# Patient Record
Sex: Female | Born: 1980 | Race: White | Hispanic: No | Marital: Married | Smoking: Never smoker
Health system: Southern US, Community
[De-identification: ages and names within clinical notes are randomized; demographics above are authoritative.]

## PROBLEM LIST (undated history)

## (undated) DIAGNOSIS — N63 Unspecified lump in unspecified breast: Secondary | ICD-10-CM

## (undated) DIAGNOSIS — E669 Obesity, unspecified: Secondary | ICD-10-CM

## (undated) DIAGNOSIS — N920 Excessive and frequent menstruation with regular cycle: Principal | ICD-10-CM

## (undated) DIAGNOSIS — N946 Dysmenorrhea, unspecified: Secondary | ICD-10-CM

## (undated) DIAGNOSIS — E8881 Metabolic syndrome: Secondary | ICD-10-CM

## (undated) DIAGNOSIS — Z6828 Body mass index (BMI) 28.0-28.9, adult: Secondary | ICD-10-CM

## (undated) DIAGNOSIS — E88819 Insulin resistance, unspecified: Secondary | ICD-10-CM

## (undated) HISTORY — PX: TUBAL LIGATION: SHX77

## (undated) HISTORY — DX: Obesity, unspecified: E66.9

## (undated) HISTORY — DX: Excessive and frequent menstruation with regular cycle: N92.0

## (undated) HISTORY — DX: Dysmenorrhea, unspecified: N94.6

## (undated) HISTORY — PX: OTHER SURGICAL HISTORY: SHX169

## (undated) HISTORY — DX: Insulin resistance, unspecified: E88.819

## (undated) HISTORY — DX: Body mass index (bmi) 28.0-28.9, adult: Z68.28

## (undated) HISTORY — DX: Unspecified lump in unspecified breast: N63.0

## (undated) HISTORY — PX: CHOLECYSTECTOMY: SHX55

## (undated) HISTORY — DX: Metabolic syndrome: E88.81

## (undated) HISTORY — PX: CARPAL TUNNEL RELEASE: SHX101

---

## 2005-01-10 ENCOUNTER — Ambulatory Visit (HOSPITAL_COMMUNITY): Admission: AD | Admit: 2005-01-10 | Discharge: 2005-01-10 | Payer: Self-pay | Admitting: Obstetrics and Gynecology

## 2005-01-11 ENCOUNTER — Ambulatory Visit (HOSPITAL_COMMUNITY): Admission: AD | Admit: 2005-01-11 | Discharge: 2005-01-11 | Payer: Self-pay | Admitting: Obstetrics and Gynecology

## 2005-01-12 ENCOUNTER — Ambulatory Visit (HOSPITAL_COMMUNITY): Admission: AD | Admit: 2005-01-12 | Discharge: 2005-01-12 | Payer: Self-pay | Admitting: Obstetrics and Gynecology

## 2005-01-18 ENCOUNTER — Observation Stay (HOSPITAL_COMMUNITY): Admission: AD | Admit: 2005-01-18 | Discharge: 2005-01-19 | Payer: Self-pay | Admitting: Obstetrics and Gynecology

## 2005-01-29 ENCOUNTER — Ambulatory Visit (HOSPITAL_COMMUNITY): Admission: AD | Admit: 2005-01-29 | Discharge: 2005-01-29 | Payer: Self-pay | Admitting: Obstetrics and Gynecology

## 2005-02-08 ENCOUNTER — Inpatient Hospital Stay (HOSPITAL_COMMUNITY): Admission: AD | Admit: 2005-02-08 | Discharge: 2005-02-11 | Payer: Self-pay | Admitting: Obstetrics and Gynecology

## 2005-02-15 ENCOUNTER — Inpatient Hospital Stay (HOSPITAL_COMMUNITY): Admission: EM | Admit: 2005-02-15 | Discharge: 2005-02-15 | Payer: Self-pay | Admitting: Emergency Medicine

## 2005-02-28 ENCOUNTER — Ambulatory Visit (HOSPITAL_COMMUNITY): Admission: RE | Admit: 2005-02-28 | Discharge: 2005-02-28 | Payer: Self-pay | Admitting: General Surgery

## 2005-07-06 ENCOUNTER — Emergency Department (HOSPITAL_COMMUNITY): Admission: EM | Admit: 2005-07-06 | Discharge: 2005-07-06 | Payer: Self-pay | Admitting: Emergency Medicine

## 2005-12-17 ENCOUNTER — Emergency Department (HOSPITAL_COMMUNITY): Admission: EM | Admit: 2005-12-17 | Discharge: 2005-12-17 | Payer: Self-pay | Admitting: Emergency Medicine

## 2005-12-20 ENCOUNTER — Emergency Department (HOSPITAL_COMMUNITY): Admission: EM | Admit: 2005-12-20 | Discharge: 2005-12-20 | Payer: Self-pay | Admitting: Emergency Medicine

## 2006-06-03 ENCOUNTER — Emergency Department (HOSPITAL_COMMUNITY): Admission: EM | Admit: 2006-06-03 | Discharge: 2006-06-03 | Payer: Self-pay | Admitting: Emergency Medicine

## 2006-08-21 ENCOUNTER — Emergency Department (HOSPITAL_COMMUNITY): Admission: EM | Admit: 2006-08-21 | Discharge: 2006-08-22 | Payer: Self-pay | Admitting: Emergency Medicine

## 2007-05-28 ENCOUNTER — Other Ambulatory Visit: Admission: RE | Admit: 2007-05-28 | Discharge: 2007-05-28 | Payer: Self-pay | Admitting: Obstetrics and Gynecology

## 2007-07-28 ENCOUNTER — Inpatient Hospital Stay (HOSPITAL_COMMUNITY): Admission: AD | Admit: 2007-07-28 | Discharge: 2007-07-28 | Payer: Self-pay | Admitting: Obstetrics and Gynecology

## 2007-09-17 ENCOUNTER — Other Ambulatory Visit: Payer: Self-pay | Admitting: Family Medicine

## 2007-09-17 ENCOUNTER — Emergency Department (HOSPITAL_COMMUNITY): Admission: EM | Admit: 2007-09-17 | Discharge: 2007-09-18 | Payer: Self-pay | Admitting: Emergency Medicine

## 2008-02-09 ENCOUNTER — Inpatient Hospital Stay (HOSPITAL_COMMUNITY): Admission: AD | Admit: 2008-02-09 | Discharge: 2008-02-10 | Payer: Self-pay | Admitting: Obstetrics & Gynecology

## 2008-02-09 ENCOUNTER — Ambulatory Visit: Payer: Self-pay | Admitting: Obstetrics and Gynecology

## 2008-02-21 ENCOUNTER — Inpatient Hospital Stay (HOSPITAL_COMMUNITY): Admission: AD | Admit: 2008-02-21 | Discharge: 2008-02-24 | Payer: Self-pay | Admitting: Obstetrics & Gynecology

## 2008-02-21 ENCOUNTER — Ambulatory Visit: Payer: Self-pay | Admitting: Advanced Practice Midwife

## 2008-07-15 ENCOUNTER — Other Ambulatory Visit: Admission: RE | Admit: 2008-07-15 | Discharge: 2008-07-15 | Payer: Self-pay | Admitting: Obstetrics and Gynecology

## 2008-09-07 ENCOUNTER — Emergency Department (HOSPITAL_COMMUNITY): Admission: EM | Admit: 2008-09-07 | Discharge: 2008-09-07 | Payer: Self-pay | Admitting: Emergency Medicine

## 2009-02-02 ENCOUNTER — Ambulatory Visit: Payer: Self-pay | Admitting: Obstetrics & Gynecology

## 2009-02-02 ENCOUNTER — Inpatient Hospital Stay (HOSPITAL_COMMUNITY): Admission: AD | Admit: 2009-02-02 | Discharge: 2009-02-04 | Payer: Self-pay | Admitting: Obstetrics & Gynecology

## 2010-04-25 ENCOUNTER — Emergency Department (HOSPITAL_COMMUNITY)
Admission: EM | Admit: 2010-04-25 | Discharge: 2010-04-25 | Payer: Self-pay | Source: Home / Self Care | Admitting: Emergency Medicine

## 2010-06-09 ENCOUNTER — Other Ambulatory Visit (HOSPITAL_COMMUNITY)
Admission: RE | Admit: 2010-06-09 | Discharge: 2010-06-09 | Disposition: A | Discharge status: Home / Self Care | Payer: Managed Care, Other (non HMO) | Source: Ambulatory Visit | Attending: Obstetrics and Gynecology | Admitting: Obstetrics and Gynecology

## 2010-06-09 ENCOUNTER — Other Ambulatory Visit: Payer: Self-pay | Admitting: Adult Health

## 2010-06-09 DIAGNOSIS — Z01419 Encounter for gynecological examination (general) (routine) without abnormal findings: Secondary | ICD-10-CM | POA: Insufficient documentation

## 2010-06-17 ENCOUNTER — Emergency Department (HOSPITAL_COMMUNITY): Payer: Managed Care, Other (non HMO)

## 2010-06-17 ENCOUNTER — Emergency Department (HOSPITAL_COMMUNITY)
Admission: EM | Admit: 2010-06-17 | Discharge: 2010-06-17 | Disposition: A | Discharge status: Home / Self Care | Payer: Managed Care, Other (non HMO) | Attending: Emergency Medicine | Admitting: Emergency Medicine

## 2010-06-17 DIAGNOSIS — M549 Dorsalgia, unspecified: Secondary | ICD-10-CM | POA: Insufficient documentation

## 2010-06-17 DIAGNOSIS — Z79899 Other long term (current) drug therapy: Secondary | ICD-10-CM | POA: Insufficient documentation

## 2010-06-17 DIAGNOSIS — R091 Pleurisy: Secondary | ICD-10-CM | POA: Insufficient documentation

## 2010-06-17 DIAGNOSIS — R071 Chest pain on breathing: Secondary | ICD-10-CM | POA: Insufficient documentation

## 2010-06-17 DIAGNOSIS — M542 Cervicalgia: Secondary | ICD-10-CM | POA: Insufficient documentation

## 2010-06-17 LAB — D-DIMER, QUANTITATIVE: D-Dimer, Quant: 0.22 ug/mL-FEU (ref 0.00–0.48)

## 2010-07-14 LAB — RH IMMUNE GLOB WKUP(>/=20WKS)(NOT WOMEN'S HOSP): Fetal Screen: NEGATIVE

## 2010-07-14 LAB — CBC
HCT: 31.4 % — ABNORMAL LOW (ref 36.0–46.0)
HCT: 40.7 % (ref 36.0–46.0)
Hemoglobin: 10.7 g/dL — ABNORMAL LOW (ref 12.0–15.0)
Hemoglobin: 13.6 g/dL (ref 12.0–15.0)
MCHC: 33.5 g/dL (ref 30.0–36.0)
MCHC: 34.2 g/dL (ref 30.0–36.0)
MCV: 90.1 fL (ref 78.0–100.0)
MCV: 90.7 fL (ref 78.0–100.0)
Platelets: 126 10*3/uL — ABNORMAL LOW (ref 150–400)
Platelets: 148 10*3/uL — ABNORMAL LOW (ref 150–400)
RBC: 3.48 MIL/uL — ABNORMAL LOW (ref 3.87–5.11)
RBC: 4.49 MIL/uL (ref 3.87–5.11)
RDW: 14.8 % (ref 11.5–15.5)
RDW: 15 % (ref 11.5–15.5)
WBC: 9.6 10*3/uL (ref 4.0–10.5)
WBC: 9.7 10*3/uL (ref 4.0–10.5)

## 2010-07-14 LAB — GLUCOSE, CAPILLARY
Glucose-Capillary: 74 mg/dL (ref 70–99)
Glucose-Capillary: 83 mg/dL (ref 70–99)
Glucose-Capillary: 84 mg/dL (ref 70–99)
Glucose-Capillary: 93 mg/dL (ref 70–99)

## 2010-07-14 LAB — RPR: RPR Ser Ql: NONREACTIVE

## 2010-08-26 NOTE — Consult Note (Signed)
NAMEMATILYNN, DACEY NO.:  [id number]   MEDICAL RECORD NO.:  [id number]          PATIENT TYPE:  OIB   LOCATION:  LDR1                          FACILITY:  [facility]   PHYSICIAN:  Tilda Burrow, M.D. DATE OF BIRTH:  11-24-1980   DATE OF CONSULTATION:  DATE OF DISCHARGE:  01/29/2005                                   CONSULTATION   REASON FOR CONSULTATION:  Rule out labor.   HISTORY:  [name] is seen in labor and delivery on January 29, 2005 with  complaints of apparently regular contractions with no bleeding or rupture of  membranes.  She was placed on the monitor, which revealed a reactive  pattern.  Fetal heart was in the 160s-170s with no decelerations.  She heard  that she is afebrile.  Exam of the cervix is multiparous but tight  fingertip, posterior, long with ballotable vertex presentation.   IMPRESSION:  False labor.   DISPOSITION:  The patient is discharged home.  Labor and delivery time less  than one hour.      Tilda Burrow, M.D.  Electronically Signed     JVF/MEDQ  D:  01/29/2005  T:  01/30/2005  Job:  [id number]

## 2010-08-26 NOTE — H&P (Signed)
NAMEMASEY, SCHEIBER NO.:  [id number]   MEDICAL RECORD NO.:  [id number]          PATIENT TYPE:  OIB   LOCATION:  LDR4                          FACILITY:  [facility]   PHYSICIAN:  Tilda Burrow, M.D. DATE OF BIRTH:  Oct 26, 1980   DATE OF ADMISSION:  02/07/2005  DATE OF DISCHARGE:  LH                                HISTORY & PHYSICAL   OBSERVATION ADMISSION   REASON FOR ADMISSION:  Rule out labor.   HISTORY OF PRESENT ILLNESS:  The patient came in complaining of uterine  contractions.  The patient is for induction tonight so the patient will be  admitted and observed until followed up tonight at 5:00 P.M.   PAST MEDICAL HISTORY:  Negative.   PAST SURGICAL HISTORY:  Negative.   OBSTETRICAL HISTORY:  Prenatal course essentially uneventful.   LABORATORY DATA:  Blood type A negative.  Urine drug screen is negative.  Rubella is immune.  Hepatitis B surface antigen is negative.  HIV is  negative.  HSV is negative. Serology is nonreactive.  Pap smear is normal.  Gonorrhea and Chlamydia negative.  Repeat studies negative.  28 week  hemoglobin 13.2, 28 week hematocrit 40. One hour glucose was 142.  GBS is  unknown.   PLAN:  Admit for Foley bulb induction and will assess group B Strep status  and forward from the office the patient's GBS.  If positive, will treat with  intravenous Ampicillin 2 grams STAT and then 1 gram q.4h.      Zerita Boers, N.M.      Tilda Burrow, M.D.  Electronically Signed    DL/MEDQ  D:  40/34/7425  T:  02/08/2005  Job:  [id number]   cc:   Francoise Schaumann. Halford Chessman  Fax: [phone]   [facility]

## 2010-08-26 NOTE — H&P (Signed)
[name], [name] NO.:  [id number]   MEDICAL RECORD NO.:  [id number]           PATIENT TYPE:  OIB   LOCATION:  LDR4                          FACILITY:  [facility]   PHYSICIAN:  Lazaro Arms, M.D.   DATE OF BIRTH:  February 09, 1981   DATE OF ADMISSION:  DATE OF DISCHARGE:  LH                                HISTORY & PHYSICAL   CHIEF COMPLAINT:  Induction of labor secondary to maternal discomfort.   HISTORY:  The patient is a 30 year old, gravida 2, para 1, with an EDC of  February 13, 2005, based on first and second trimester ultrasound and  correlating last menstrual period.  She began prenatal care at about [redacted] weeks  gestation and has had regular visits since then.  Prenatal blood pressure  has been anywhere from 112 to 142/60s to 80s.  Total weight gain has been 10  pounds with appropriate fundal height growth.   PRENATAL LABORATORY DATA:  Blood type A negative.  Rubella is immune.  HBsAg, HIV, HSV, RPR, gonorrhea, and chlamydia are all negative. She had an  abnormal glucose tolerance test screening with subsequent normal 3-hour GTT.  Group B strep is also negative.  MSAFP was also normal.   PAST MEDICAL HISTORY:  Noncontributory.   PAST SURGICAL HISTORY:  None.   ALLERGIES:  No known drug allergies.   MEDICATIONS:  None.   SOCIAL HISTORY:  Denies cigarette smoking, alcohol or drug use.  She is  married.   OB HISTORY:  Had a vaginal delivery in 2003 at [redacted] weeks gestation that was a  5 pound 13 ounce female in [place].  She states she had pregnancy-  induced hypertension at the end of the pregnancy.   PHYSICAL EXAMINATION:  HEENT:  Within normal limits.  HEART:  Regular rate and rhythm.  LUNGS: Clear.  ABDOMEN:  Soft and nontender.  Fundal height is about 40 cm.  CERVICAL EXAM: Cervix is 2 cm, thick, -1 station.  EXTREMITIES:  Legs have trace edema.   IMPRESSION:  1.  Intrauterine pregnancy at 39-1/2 weeks.  2.  Elective induction of labor.   The  risks and benefits of being induced versus spontaneous labor were  discussed with the patient by Zerita Boers, nurse midwife, and they  decided together that inducing would be the way to go due to maternal  discomfort.  The patient plans to use [facility] for pediatrics.      [name], C.N.M.      Lazaro Arms, M.D.  Electronically Signed    FC/MEDQ  D:  02/07/2005  T:  02/07/2005  Job:  [id number]

## 2010-08-26 NOTE — Op Note (Signed)
[name], [name] NO.:  [id number]   MEDICAL RECORD NO.:  [id number]           PATIENT TYPE:  INP   LOCATION:  A402                          FACILITY:  [facility]   PHYSICIAN:  Tilda Burrow, M.D. DATE OF BIRTH:  26-Aug-1980   DATE OF PROCEDURE:  DATE OF DISCHARGE:                                 OPERATIVE REPORT   DELIVERY NOTE:  [name] progressed steadily through labor and developed an  irresistible urge to push at approximately 1315.  She arrived to labor and  delivery.  She was noted to be 10 cm dilated at +1 station.  She had a  pretty quick second stage; and had a spontaneous vaginal delivery of a  viable, female infant at 64.  The mouth and nose were suctioned on the  perineum; and the shoulder was delivered without difficulty.  Weight is 6  pounds 6 ounces, Apgars are 9 and 9.  Twenty units of Pitocin diluted in  1000 mL of lactated Ringers was then infused rapidly IV.   The placenta separated spontaneously and delivered by a controlled cord  traction and maternal pushing effort at 1348.  It was inspected and appears  to be intact with a 3-vessel cord.  The vagina was inspected and no  lacerations were found.  The estimated blood loss was 250 mL.  The epidural  catheter was then removed with the blue tip visualized as being intact.      [name], C.N.M.      Tilda Burrow, M.D.  Electronically Signed    FC/MEDQ  D:  02/09/2005  T:  02/09/2005  Job:  [id number]   cc:   Francoise Schaumann. Halford Chessman  Fax: [phone]

## 2010-08-26 NOTE — H&P (Signed)
NAMEGENNY, CAULDER NO.:  [id number]   MEDICAL RECORD NO.:  [id number]          PATIENT TYPE:  OIB   LOCATION:  LDR5                          FACILITY:  [facility]   PHYSICIAN:  Tilda Burrow, M.D. DATE OF BIRTH:  [birth year]   DATE OF ADMISSION:  01/11/2005  DATE OF DISCHARGE:  LH                                HISTORY & PHYSICAL   REASON FOR OBSERVATION ADMISSION:  Pregnancy 36 weeks with nausea and  vomiting, abdominal pain, diarrhea, and headache.   HISTORY OF PRESENT ILLNESS:  This has been going on since last night with  [place].  She now presents with the same said symptoms.   PHYSICAL EXAMINATION:  VITAL SIGNS:  Stable.  FETUS:  Stable.  PELVIC:  Cervix is closed, thick, anterior and high.   Ketones are positive in her urine.   PLAN:  1.  We are going to send a urine down.  2.  Get a CBC.  3.  Start an IV, __________  1,000 cc for hydration then 125.  4.  Address her nausea and vomiting and her headache.   Probable discharge later on this evening.      Zerita Boers, Lanier Clam      Tilda Burrow, M.D.  Electronically Signed    DL/MEDQ  D:  16/01/9603  T:  01/11/2005  Job:  [id number]   cc:   [facility]

## 2010-08-26 NOTE — Discharge Summary (Signed)
[name], [name]                 ACCOUNT NO.:  [id number]   MEDICAL RECORD NO.:  [id number]          PATIENT TYPE:  INP   LOCATION:  A427                          FACILITY:  [facility]   PHYSICIAN:  Tilda Burrow, M.D. DATE OF BIRTH:  1981-02-25   DATE OF ADMISSION:  02/14/2005  DATE OF DISCHARGE:  11/08/2006LH                                 DISCHARGE SUMMARY   ADMISSION DIAGNOSES:  1.  Postpartum endometritis.  2.  Fever of uncertain etiology.   DISCHARGE DIAGNOSIS:  Fever secondary to milk let down.  No evidence of  endometritis.   PROCEDURES:  1.  Intravenous antibiotic therapy x3 doses.  2.  Pelvic exam.   HISTORY OF PRESENT ILLNESS:  This 30 year old female now 8 days postpartum  was seen in the emergency room for fever recorded at home of 101.4  presenting to the hospital with emergency room evaluation showing a  temperature initially 100.1, rechecked at 98.5, tachycardic at 126,  respirations 20, blood pressure 145/72.  Exam in the emergency room revealed  complaints of continued lochia, heavy lochia and fever.  She had a white  count of 13,400, hemoglobin 12 and hematocrit 35% with 85% neutrophils.  Electrolytes were normal with BUN of 10, creatinine 0.7.  O2 saturations  were 99%.  She was admitted due to the low-grade fever and history of  elevated temperature at home.  She was admitted for IV antibiotic therapy.   HOSPITAL COURSE:  The patient was admitted shortly after midday and received  antibiotics x2 doses.  She was checked the following morning with pelvic  exam at midday revealing lochia considered within normal limits without  purulence or uterine tenderness.  She had breast engorgement from the breast  milk coming in that day and the patient not breast-feeding.  It was felt  that the most likely source of the fever was breast engorgement.  She was  observed for a few hours more and remained afebrile that afternoon and was  discharged at 6 p.m. having  received Unasyn 3 g IV x2 doses and was  discharged home in stable condition for routine postpartum followup.      Tilda Burrow, M.D.  Electronically Signed     JVF/MEDQ  D:  02/20/2005  T:  02/21/2005  Job:  [id number]

## 2010-08-26 NOTE — Op Note (Signed)
NAMEANGELLA, [name] NO.:  [id number]   MEDICAL RECORD NO.:  [id number]          PATIENT TYPE:  OIB   LOCATION:  LDR1                          FACILITY:  [facility]   PHYSICIAN:  Lazaro Arms, M.D.   DATE OF BIRTH:  09/27/1980   DATE OF PROCEDURE:  01/12/2005  DATE OF DISCHARGE:                                 OPERATIVE REPORT   [name] is actually back for the third day in a row with some complaints of  nausea, vomiting, not able to keep anything down. She was seen in labor and  delivery yesterday for the same complaint, received IV fluids, Phenergan.  She did have some lab work which was all normal except for ketones in her  urinalysis. Again, today, she dips 3+ ketones. So we are just going to give  her some IV fluids, maybe some Phenergan if she needs it, says she feels  okay right this second and does not want any now. She is having some mild  contractions about 8 to 10 minutes. Her cervix is 1, thick, -2, not any  change from when I examined her the other day. Just because she has been  sick for several days in a row, we will just take a look at her gallbladder  with an ultrasound to rule out that as a possible source of etiology for her  nausea and vomiting.      [name], C.N.M.      Lazaro Arms, M.D.  Electronically Signed    FC/MEDQ  D:  01/12/2005  T:  01/12/2005  Job:  [id number]   cc:   [facility]

## 2010-08-26 NOTE — Discharge Summary (Signed)
NAMEJO-ANN, JOHANNING NO.:  [id number]   MEDICAL RECORD NO.:  [id number]          PATIENT TYPE:  OIB   LOCATION:  LDR1                          FACILITY:  [facility]   PHYSICIAN:  Tilda Burrow, M.D. DATE OF BIRTH:  05/14/1980   DATE OF ADMISSION:  01/29/2005  DATE OF DISCHARGE:  10/22/2006LH                                 DISCHARGE SUMMARY   HOSPITAL COURSE:  Artha returns at 38 weeks complaining of active  contractions.  She has had no bleeding or gushing of fluid.  External  monitoring shows occasional mild contractions.  The patient is well-known to  this practice and prenatal records reviewed.   Cervical exam shows the cervix to be closed, long and posterior.  No change  from prior last visit.  The patient was given diagnosis of false labor and  sent home.  Routine labor instructions given.      Tilda Burrow, M.D.  Electronically Signed     JVF/MEDQ  D:  01/30/2005  T:  01/30/2005  Job:  [id number]

## 2010-08-26 NOTE — H&P (Signed)
NAMELOUANNA, [name] NO.:  [id number]   MEDICAL RECORD NO.:  [id number]          PATIENT TYPE:  OIB   LOCATION:  LDR3                          FACILITY:  [facility]   PHYSICIAN:  Tilda Burrow, M.D. DATE OF BIRTH:  1981-01-26   DATE OF ADMISSION:  01/18/2005  DATE OF DISCHARGE:  LH                                HISTORY & PHYSICAL   REASON FOR ADMISSION:  Pregnancy at 36 weeks and 3 days with lower abdominal  pain, sharp shooting pain, and inability to sleep and fatigue.   HISTORY OF PRESENT ILLNESS:  [name] has had lower abdominal pain now for the  past week.  States she is not sleeping and that the discomfort is getting  worse.  She thinks she is having contractions now, so will monitor her and  evaluate from there.   PAST MEDICAL HISTORY:  Negative.   PAST SURGICAL HISTORY:  Negative.   ALLERGIES:  No known allergies.   SOCIAL HISTORY:  She is married, and her family is present and supportive.   PRENATAL COURSE:  Essentially uneventful other than the fact that she has  had a lot of discomfort with this pregnancy.  Blood type is O negative.  Rubella is immune.  Hepatitis B surface antigen is negative.  HIV is  negative. Serology nonreactive. Pap normal. GC and chlamydia is negative.  Repeat is also negative.  AFP normal.  Twenty-eight-week hemoglobin 13, 28-  week hematocrit 40.  Glucose 142.   PHYSICAL EXAMINATION TODAY:  VITAL SIGNS:  Stable. Fetal heart rate pattern  stable with accelerations.  VAGINAL EXAM: 1 cm, thick and high.   PLAN:  1.  Therapeutic rest.  2.  Overnight observation.  3.  Probable discharge in the morning.      [name], [name]      Tilda Burrow, M.D.  Electronically Signed    DL/MEDQ  D:  16/01/9603  T:  01/18/2005  Job:  [id number]   cc:   Renette Butters

## 2010-08-26 NOTE — Op Note (Signed)
NAMEMAILYN, STEICHEN NO.:  [id number]   MEDICAL RECORD NO.:  [id number]          PATIENT TYPE:  INP   LOCATION:  LDR4                          FACILITY:  [facility]   PHYSICIAN:  Tilda Burrow, M.D. DATE OF BIRTH:  03/24/1981   DATE OF PROCEDURE:  02/09/2005  DATE OF DISCHARGE:                                 OPERATIVE REPORT   PROCEDURE:  Epidural catheter placement 9:30 a.m. on November 2.   Continuous lumbar epidural catheter placed at the L3-4 interspace on the  first attempt using loss-of-resistance technique. We were able to avoid the  tattooed areas on the skin as we raised a wheal. The interspace was  identified with some initial difficulty, and then the loss-of-resistance  technique allowed [place] to identify the epidural space at 7 cm beneath the  skin. A 5 cc test dose 1.5% lidocaine was introduced followed insertion of  the epidural catheter 3 to 4 cm into the epidural space and taping the  catheter to the back. We gave her 12 cc of continuous infusion. The patient  was beginning to get symmetric analgesic effect up to the groin at the time  of this dictation. We will not give an initial bolus as the patient is only  2 cm. Prognosis for vaginal delivery is uncertain at this time.      Tilda Burrow, M.D.  Electronically Signed     JVF/MEDQ  D:  02/09/2005  T:  02/09/2005  Job:  [id number]   cc:   [facility]

## 2010-08-26 NOTE — Discharge Summary (Signed)
NAMELUBA, MATZEN NO.:  [id number]   MEDICAL RECORD NO.:  [id number]          PATIENT TYPE:  INP   LOCATION:  A402                          FACILITY:  [facility]   PHYSICIAN:  Tilda Burrow, M.D. DATE OF BIRTH:  [birth year]   DATE OF ADMISSION:  02/07/2005  DATE OF DISCHARGE:  11/04/2006LH                                 DISCHARGE SUMMARY   HISTORY:  This 30 year old female gravida 2, para 1, was admitted for  induction due to maternal discomfort at 39-1/[redacted] weeks gestation. The  procedure was originally scheduled for February 07, 2005 and rescheduled for  November 1.   PROCEDURES:  1.  Foley bulb cervical ripening unsuccessful.  2.  Cervidil cervical ripening, discontinued.  3.  Continuous lumbar epidural, Dr. Jannifer Franklin.  4.  Spontaneous vertex vaginal delivery  5.  RhoGAM injection.   HOSPITAL COURSE:  The patient was admitted for induction of labor. Efforts  at Foley bulb cervical ripening were unsuccessful. We then used Cervidil  over night to attempt cervical ripening. This was not tolerated by the baby  due to hyperstimulation response. She therefore had this removed and the  following morning was simply placed on Pitocin induction of labor. EPIDURAL  ANALGESIA was placed by Dr. Jannifer Franklin.   She progressed steadily. Developed _complete dilation_ at 1:15p.m.,  delivered at 1:44 p.m. with a 6 pound, 5 ounce female infant, Apgar's 9 and 9.  There was no laceration requiring repair. EBL 256 cc. Epidural catheter was  removed with tip intact.   Postpartum course was straight forward with patient stable for discharge on  February 11, 2005. Maternal blood type is A negative. RhoGAM injection  administered. The patient was discharged home on February 11, 2005 with plans  for further contraception being an IUD after 4 weeks.      Tilda Burrow, M.D.  Electronically Signed     JVF/MEDQ  D:  02/11/2005  T:  02/11/2005  Job:  [id number]

## 2010-08-26 NOTE — Group Therapy Note (Signed)
[name], [name] NO.:  [id number]   MEDICAL RECORD NO.:  [id number]          PATIENT TYPE:  OIB   LOCATION:  LDR3                          FACILITY:  [facility]   PHYSICIAN:  Lazaro Arms, M.D.   DATE OF BIRTH:  Feb 24, 1981   DATE OF PROCEDURE:  01/10/2005  DATE OF DISCHARGE:                                   PROGRESS NOTE   [name] came to labor and delivery with complaints of questionable labor,  questionable rupture of membranes.  She describes as having to change her  panties or her panties getting wet more often than usual, no running down  the leg or anything like that.  She was monitored for two hours  electronically.  Just an occasional mild contraction was seen.  Fetal heart  rate was very reactant.  Sterile speculum exam was performed.  No pooling of  fluids.  Nitrazine negative and fern negative and appears visually to be  normal physiological fluid.  Cervix is fingertip dilated, fixed, -2 station.   IMPRESSION:  Intrauterine pregnancy at 35-1/2 weeks.  Normal physiological  discharge.  Pains of pregnancy.  She was discharged home in stable  condition.      [name], C.N.M.      Lazaro Arms, M.D.  Electronically Signed    FC/MEDQ  D:  01/10/2005  T:  01/11/2005  Job:  [id number]

## 2011-01-03 LAB — URINALYSIS, ROUTINE W REFLEX MICROSCOPIC
Bilirubin Urine: NEGATIVE
Glucose, UA: NEGATIVE
Hgb urine dipstick: NEGATIVE
Ketones, ur: 15 — AB
Nitrite: NEGATIVE
Protein, ur: NEGATIVE
Specific Gravity, Urine: 1.025
Urobilinogen, UA: 0.2
pH: 6.5

## 2011-01-03 LAB — BASIC METABOLIC PANEL
BUN: 7
CO2: 27
Calcium: 9
Chloride: 105
Creatinine, Ser: 0.63
GFR calc Af Amer: >60
GFR calc non Af Amer: >60
Glucose, Bld: 106 — ABNORMAL HIGH
Potassium: 3.9
Sodium: 138

## 2011-01-03 LAB — DIFFERENTIAL
Basophils Absolute: 0
Basophils Relative: 0
Eosinophils Absolute: 0.1
Eosinophils Relative: 1
Lymphocytes Relative: 32
Lymphs Abs: 3.1
Monocytes Absolute: 0.6
Monocytes Relative: 6
Neutro Abs: 6
Neutrophils Relative %: 61

## 2011-01-03 LAB — CBC
HCT: 37.2
Hemoglobin: 13.1
MCHC: 35.2
MCV: 87.1
Platelets: 217
RBC: 4.26
RDW: 13.1
WBC: 9.8

## 2011-01-03 LAB — POCT PREGNANCY, URINE
Operator id: 13440
Preg Test, Ur: POSITIVE

## 2011-01-05 LAB — DIFFERENTIAL
Basophils Absolute: 0.1
Basophils Relative: 1
Eosinophils Absolute: 0
Eosinophils Relative: 0
Lymphocytes Relative: 28
Lymphs Abs: 3.2
Monocytes Absolute: 0.6
Monocytes Relative: 5
Neutro Abs: 7.3
Neutrophils Relative %: 66

## 2011-01-05 LAB — URINALYSIS, ROUTINE W REFLEX MICROSCOPIC
Bilirubin Urine: NEGATIVE
Glucose, UA: 100 — AB
Ketones, ur: 15 — AB
pH: 7

## 2011-01-05 LAB — POCT I-STAT, CHEM 8
BUN: 6
Calcium, Ion: 1 — ABNORMAL LOW
Chloride: 109
Creatinine, Ser: 0.7
Glucose, Bld: 95
HCT: 36
Hemoglobin: 12.2
Potassium: 3.9
Sodium: 137
TCO2: 20

## 2011-01-05 LAB — CBC
HCT: 38.1
Hemoglobin: 13.7
MCHC: 35.8
MCV: 88.2
Platelets: 197
RBC: 4.33
RDW: 13.7
WBC: 11.2 — ABNORMAL HIGH

## 2011-01-05 LAB — COMPREHENSIVE METABOLIC PANEL
Albumin: 2.9 — ABNORMAL LOW
Alkaline Phosphatase: 32 — ABNORMAL LOW
BUN: 5 — ABNORMAL LOW
Chloride: 107
Glucose, Bld: 101 — ABNORMAL HIGH
Potassium: 3.8
Total Bilirubin: 0.4

## 2011-01-05 LAB — POCT CARDIAC MARKERS
CKMB, poc: <1 — ABNORMAL LOW
Myoglobin, poc: 17.4
Operator id: 277751
Troponin i, poc: <0.05

## 2011-01-05 LAB — D-DIMER, QUANTITATIVE: D-Dimer, Quant: 0.51 — ABNORMAL HIGH

## 2011-01-10 LAB — CBC
MCV: 89.4
Platelets: 155
RDW: 14.2
WBC: 9.2

## 2011-01-10 LAB — GLUCOSE, CAPILLARY
Glucose-Capillary: 75
Glucose-Capillary: 75
Glucose-Capillary: 95
Glucose-Capillary: 99

## 2011-01-10 LAB — RPR: RPR Ser Ql: NONREACTIVE

## 2012-07-26 ENCOUNTER — Emergency Department: Payer: Self-pay | Admitting: Emergency Medicine

## 2012-07-26 LAB — CBC
HGB: 13.2 g/dL (ref 12.0–16.0)
MCH: 28.9 pg (ref 26.0–34.0)
MCV: 85 fL (ref 80–100)
RBC: 4.56 10*6/uL (ref 3.80–5.20)
WBC: 8.6 10*3/uL (ref 3.6–11.0)

## 2012-07-26 LAB — COMPREHENSIVE METABOLIC PANEL
Albumin: 3.9 g/dL (ref 3.4–5.0)
BUN: 9 mg/dL (ref 7–18)
Chloride: 104 mmol/L (ref 98–107)
Co2: 31 mmol/L (ref 21–32)
EGFR (African American): >60
Potassium: 4 mmol/L (ref 3.5–5.1)
SGOT(AST): 30 U/L (ref 15–37)
Total Protein: 7.6 g/dL (ref 6.4–8.2)

## 2012-07-26 LAB — PREGNANCY, URINE: Pregnancy Test, Urine: NEGATIVE m[IU]/mL

## 2012-07-26 LAB — URINALYSIS, COMPLETE
Bilirubin,UR: NEGATIVE
Nitrite: NEGATIVE

## 2012-09-11 ENCOUNTER — Encounter: Payer: Self-pay | Admitting: *Deleted

## 2012-09-12 ENCOUNTER — Ambulatory Visit: Payer: Self-pay | Admitting: Adult Health

## 2013-11-27 ENCOUNTER — Ambulatory Visit (INDEPENDENT_AMBULATORY_CARE_PROVIDER_SITE_OTHER): Payer: No Typology Code available for payment source | Admitting: Adult Health

## 2013-11-27 ENCOUNTER — Encounter: Payer: Self-pay | Admitting: Adult Health

## 2013-11-27 VITALS — BP 122/70 | Ht 62.0 in | Wt 193.5 lb

## 2013-11-27 DIAGNOSIS — N63 Unspecified lump in unspecified breast: Secondary | ICD-10-CM

## 2013-11-27 DIAGNOSIS — R928 Other abnormal and inconclusive findings on diagnostic imaging of breast: Secondary | ICD-10-CM

## 2013-11-27 HISTORY — DX: Unspecified lump in unspecified breast: N63.0

## 2013-11-27 NOTE — Patient Instructions (Signed)
Breast Cyst A breast cyst is a sac in the breast that is filled with fluid. Breast cysts are common in women. Women can have one or many cysts. When the breasts contain many cysts, it is usually due to a noncancerous (benign) condition called fibrocystic change. These lumps form under the influence of female hormones (estrogen and progesterone). The lumps are most often located in the upper, outer portion of the breast. They are often more swollen, painful, and tender before your period starts. They usually disappear after menopause, unless you are on hormone therapy.  There are several types of cysts:  Macrocyst. This is a cyst that is about 2 in. (5.1 cm) in diameter.   Microcyst. This is a tiny cyst that you cannot feel but can be seen with a mammogram or an ultrasound.   Galactocele. This is a cyst containing milk that may develop if you suddenly stop breastfeeding.   Sebaceous cyst of the skin. This type of cyst is not in the breast tissue itself. Breast cysts do not increase your risk of breast cancer. However, they must be monitored closely because they can be cancerous.  CAUSES  It is not known exactly what causes a breast cyst to form. Possible causes include:  An overgrowth of milk glands and connective tissue in the breast can block the milk glands, causing them to fill with fluid.   Scar tissue in the breast from previous surgery may block the glands, causing a cyst.  RISK FACTORS Estrogen may influence the development of a breast cyst.  SIGNS AND SYMPTOMS   Feeling a smooth, round, soft lump (like a grape) in the breast that is easily moveable.   Breast discomfort or pain.  Increase in size of the lump before your menstrual period and decrease in its size after your menstrual period.  DIAGNOSIS  A cyst can be felt during a physical exam by your health care provider. A breast X-ray exam (mammogram) and ultrasonography will be done to confirm the diagnosis. Fluid may  be removed from the cyst with a needle (fine needle aspiration) to make sure the cyst is not cancerous.  TREATMENT  Treatment may not be necessary. Your health care provider may monitor the cyst to see if it goes away on its own. If treatment is needed, it may include:  Hormone treatment.   Needle aspiration. There is a chance of the cyst coming back after aspiration.   Surgery to remove the whole cyst.  HOME CARE INSTRUCTIONS   Keep all follow-up appointments with your health care provider.  See your health care provider regularly:  Get a yearly exam by your health care provider.  Have a clinical breast exam by a health care provider every 1-3 years if you are 20-40 years of age. After age 40 years, you should have the exam every year.   Get mammogram tests as directed by your health care provider.   Understand the normal appearance and feel of your breasts and perform breast self-exams.   Only take over-the-counter or prescription medicines as directed by your health care provider.   Wear a supportive bra, especially when exercising.   Avoid caffeine.   Reduce your salt intake, especially before your menstrual period. Too much salt can cause fluid retention, breast swelling, and discomfort.  SEEK MEDICAL CARE IF:   You feel, or think you feel, a lump in your breast.   You notice that both breasts look or feel different than usual.   Your   breast is still causing pain after your menstrual period is over.   You need medicine for breast pain and swelling that occurs with your menstrual period.  SEEK IMMEDIATE MEDICAL CARE IF:   You have severe pain, tenderness, redness, or warmth in your breast.   You have nipple discharge or bleeding.   Your breast lump becomes hard and painful.   You find new lumps or bumps that were not there before.   You feel lumps in your armpit (axilla).   You notice dimpling or wrinkling of the breast or nipple.   You  have a fever.  MAKE SURE YOU:  Understand these instructions.  Will watch your condition.  Will get help right away if you are not doing well or get worse. Document Released: 03/27/2005 Document Revised: 11/27/2012 Document Reviewed: 10/24/2012 [facility] Patient Information 2015 [place], [place]. This information is not intended to replace advice given to you by your health care provider. Make sure you discuss any questions you have with your health care provider. Mammogram 9/8 at 1:30 pm at [facility] Return in 4 weeks for pap and physical

## 2013-11-27 NOTE — Progress Notes (Signed)
Subjective:     Patient ID: [name], female   DOB: [birth year], 33 y.o.   MRN: [id number]  HPI [name] is a 33 year old white female, married, in complaining of right breast lump that is tender and has been there several months.  Review of Systems See HPI Reviewed past medical,surgical, social and family history. Reviewed medications and allergies.     Objective:   Physical Exam BP 122/70  Ht 5\' 2"  (1.575 m)  Wt 193 lb 8 oz (87.771 kg)  BMI 35.38 kg/m2  LMP 11/01/2013    Skin warm and dry,  Breasts:no dominate palpable mass, retraction or nipple discharge on the left,On the right, no retraction or nipple discharge, has nodule at 11 0'clock 2 FB from areola,that is firm and mobile, and a nodule at 1 o'clock that is 5 FB from areola that is tender and non mobile, will get diagnostic mammogram.  Assessment:     Right breast nodules   Plan:     Diagnostic mammogram at [facility] 9/8 at 1:40 pm Return in 4 weeks for pap and physical Review handout on breast cyst

## 2013-12-16 ENCOUNTER — Other Ambulatory Visit: Payer: Self-pay | Admitting: Adult Health

## 2013-12-16 ENCOUNTER — Ambulatory Visit (HOSPITAL_COMMUNITY)
Admission: RE | Admit: 2013-12-16 | Discharge: 2013-12-16 | Disposition: A | Discharge status: Home / Self Care | Payer: No Typology Code available for payment source | Source: Ambulatory Visit | Attending: Adult Health | Admitting: Adult Health

## 2013-12-16 DIAGNOSIS — N63 Unspecified lump in unspecified breast: Secondary | ICD-10-CM

## 2013-12-16 DIAGNOSIS — R928 Other abnormal and inconclusive findings on diagnostic imaging of breast: Secondary | ICD-10-CM | POA: Diagnosis present

## 2014-02-09 ENCOUNTER — Encounter: Payer: Self-pay | Admitting: Adult Health

## 2014-09-25 ENCOUNTER — Encounter: Payer: Self-pay | Admitting: Adult Health

## 2014-09-25 ENCOUNTER — Ambulatory Visit (INDEPENDENT_AMBULATORY_CARE_PROVIDER_SITE_OTHER): Payer: No Typology Code available for payment source | Admitting: Adult Health

## 2014-09-25 VITALS — BP 120/62 | HR 72 | Ht 62.0 in | Wt 180.5 lb

## 2014-09-25 DIAGNOSIS — Z713 Dietary counseling and surveillance: Secondary | ICD-10-CM

## 2014-09-25 DIAGNOSIS — E669 Obesity, unspecified: Secondary | ICD-10-CM

## 2014-09-25 HISTORY — DX: Obesity, unspecified: E66.9

## 2014-09-25 MED ORDER — PHENTERMINE HCL 37.5 MG PO TABS: 37.5000 mg | ORAL_TABLET | Freq: Every day | ORAL | Status: DC

## 2014-09-25 NOTE — Progress Notes (Signed)
Subjective:     Patient ID: [name], female   DOB: 12-Sep-1980, 34 y.o.   MRN: [id number]  HPI Atiyana is a 34 year old white female, married in complaining with her weight and wants to try meds to help jump start efforts, has stopped sodas for last 3 days.  Review of Systems Patient denies any headaches, hearing loss, fatigue, blurred vision, shortness of breath, chest pain, abdominal pain, problems with bowel movements, urination, or intercourse. No joint pain or mood swings. Reviewed past medical,surgical, social and family history. Reviewed medications and allergies.     Objective:   Physical Exam BP 120/62 mmHg  Pulse 72  Ht 5\' 2"  (1.575 m)  Wt 180 lb 8 oz (81.874 kg)  BMI 33.01 kg/m2  LMP 09/02/2014 Skin warm and dry. Neck: mid line trachea, normal thyroid, good ROM, no lymphadenopathy noted. Lungs: clear to ausculation bilaterally. Cardiovascular: regular rate and rhythm.Discussed eating more protein and less carbs, will rx adipex too.    Assessment:     Weight loss counseling Obesity     Plan:     Rx adipex 37.5 mg #30 take 1 daily Return in 4 weeks for pap and physical Review handout on weight loss

## 2014-09-25 NOTE — Patient Instructions (Signed)

## 2014-10-26 ENCOUNTER — Ambulatory Visit (INDEPENDENT_AMBULATORY_CARE_PROVIDER_SITE_OTHER): Payer: BLUE CROSS/BLUE SHIELD | Admitting: Adult Health

## 2014-10-26 ENCOUNTER — Other Ambulatory Visit (HOSPITAL_COMMUNITY)
Admission: RE | Admit: 2014-10-26 | Discharge: 2014-10-26 | Disposition: A | Discharge status: Home / Self Care | Payer: BLUE CROSS/BLUE SHIELD | Source: Ambulatory Visit | Attending: Adult Health | Admitting: Adult Health

## 2014-10-26 ENCOUNTER — Encounter: Payer: Self-pay | Admitting: Adult Health

## 2014-10-26 VITALS — BP 120/80 | HR 88 | Ht 63.0 in | Wt 165.5 lb

## 2014-10-26 DIAGNOSIS — Z1151 Encounter for screening for human papillomavirus (HPV): Secondary | ICD-10-CM | POA: Insufficient documentation

## 2014-10-26 DIAGNOSIS — Z01419 Encounter for gynecological examination (general) (routine) without abnormal findings: Secondary | ICD-10-CM | POA: Insufficient documentation

## 2014-10-26 DIAGNOSIS — E669 Obesity, unspecified: Secondary | ICD-10-CM

## 2014-10-26 DIAGNOSIS — Z713 Dietary counseling and surveillance: Secondary | ICD-10-CM

## 2014-10-26 MED ORDER — PHENTERMINE HCL 37.5 MG PO TABS: 37.5000 mg | ORAL_TABLET | Freq: Every day | ORAL | Status: DC

## 2014-10-26 NOTE — Patient Instructions (Signed)
Physical in 1 year Continue adipex Follow up in 4 weeks

## 2014-10-26 NOTE — Progress Notes (Signed)
Patient ID: Bonnye Halle Bumbaugh, female   DOB: May 19, 1980, 34 y.o.   MRN: [id number] History of Present Illness:  Madiline is a 34 year old white female in for well woman gyn exam and pap, and weight check, she has been on adipex for 4 weeks and has lost 15 lbs.And she changed her hair color, and has bought a smaller size and is very happy.  Current Medications, Allergies, Past Medical History, Past Surgical History, Family History and Social History were reviewed in [employer] record.     Review of Systems: Patient denies any headaches, hearing loss, fatigue, blurred vision, shortness of breath, chest pain, abdominal pain, problems with bowel movements, urination, or intercourse. No joint pain or mood swings.    Physical Exam:BP 120/80 mmHg  Pulse 88  Ht 5\' 3"  (1.6 m)  Wt 165 lb 8 oz (75.07 kg)  BMI 29.32 kg/m2  LMP 10/03/2014 General:  Well developed, well nourished, no acute distress Skin:  Warm and dry Neck:  Midline trachea, normal thyroid, good ROM, no lymphadenopathy Lungs; Clear to auscultation bilaterally Breast:  No dominant palpable mass, retraction, or nipple discharge Cardiovascular: Regular rate and rhythm Abdomen:  Soft, non tender, no hepatosplenomegaly Pelvic:  External genitalia is normal in appearance, no lesions.  The vagina is normal in appearance. Urethra has no lesions or masses. The cervix is bulbous, with nabothian cyst at 9 o'clock, pap with HPV performed, cervix was friable with EC brush.  Uterus is felt to be normal size, shape, and contour.  No adnexal masses or tenderness noted.Bladder is non tender, no masses felt. Extremities/musculoskeletal:  No swelling or varicosities noted, no clubbing or cyanosis Psych:  No mood changes, alert and cooperative,seems happy She wants to continue weight loss, her goal is around 140 lbs.  Impression: Well woman gyn exam with pap Weight loss counseling Obesity     Plan: Rx adipex 32.5 mg #30 take 1  daily no refills Follow up in 4 weeks Physical in 1 year, pap in 3 years if normal with negative HPV

## 2014-10-28 LAB — CYTOLOGY - PAP

## 2014-11-23 ENCOUNTER — Ambulatory Visit (INDEPENDENT_AMBULATORY_CARE_PROVIDER_SITE_OTHER): Payer: BLUE CROSS/BLUE SHIELD | Admitting: Adult Health

## 2014-11-23 ENCOUNTER — Encounter: Payer: Self-pay | Admitting: Adult Health

## 2014-11-23 VITALS — BP 118/70 | HR 80 | Ht 63.0 in | Wt 158.0 lb

## 2014-11-23 DIAGNOSIS — Z6828 Body mass index (BMI) 28.0-28.9, adult: Secondary | ICD-10-CM | POA: Diagnosis not present

## 2014-11-23 DIAGNOSIS — Z713 Dietary counseling and surveillance: Secondary | ICD-10-CM

## 2014-11-23 MED ORDER — PHENTERMINE HCL 37.5 MG PO TABS: 37.5000 mg | ORAL_TABLET | Freq: Every day | ORAL | Status: DC

## 2014-11-23 NOTE — Patient Instructions (Signed)
Continue weight loss efforts Follow up in 4 weeks

## 2014-11-23 NOTE — Progress Notes (Signed)
Subjective:     Patient ID: [name], female   DOB: 1981/03/04, 34 y.o.   MRN: [id number]  HPI [name] is a 34 year old white female, in for weight and BP check, no complaints.She just bought size 8 pants.And says she feels better.She wants to get to 140 lbs. And be able to maintain.   Review of Systems Patient denies any headaches, hearing loss, fatigue, blurred vision, shortness of breath, chest pain, abdominal pain, problems with bowel movements, urination, or intercourse. No joint pain or mood swings. Reviewed past medical,surgical, social and family history. Reviewed medications and allergies.     Objective:   Physical Exam BP 118/70 mmHg  Pulse 80  Ht 5\' 3"  (1.6 m)  Wt 158 lb (71.668 kg)  BMI 28.00 kg/m2  LMP 10/28/2014 Skin warm and dry.  Lungs: clear to ausculation bilaterally. Cardiovascular: regular rate and rhythm. She has lost 22.5 lbs total and 7.5 this last 4 weeks she wants to continue, she is eating better and is exercising on elliptical.Discussed trying to continue exercising and using fit bit.    Assessment:     Weight loss counseling BMI 28    Plan:     Refilled adipex 37.5 mg   Follow up in 4 weeks

## 2014-12-28 ENCOUNTER — Encounter: Payer: Self-pay | Admitting: Adult Health

## 2014-12-28 ENCOUNTER — Ambulatory Visit (INDEPENDENT_AMBULATORY_CARE_PROVIDER_SITE_OTHER): Payer: BLUE CROSS/BLUE SHIELD | Admitting: Adult Health

## 2014-12-28 VITALS — BP 120/70 | HR 72 | Ht 63.0 in | Wt 150.0 lb

## 2014-12-28 DIAGNOSIS — Z713 Dietary counseling and surveillance: Secondary | ICD-10-CM

## 2014-12-28 DIAGNOSIS — Z6826 Body mass index (BMI) 26.0-26.9, adult: Secondary | ICD-10-CM | POA: Diagnosis not present

## 2014-12-28 NOTE — Progress Notes (Signed)
Subjective:     Patient ID: [name], female   DOB: 12-Sep-1980, 34 y.o.   MRN: [id number]  HPI [name] is a 34 year old white female, married in for weight and BP check, has been on adipex for 3 months and has lost 30.5 lbs total, 8 in last 4 weeks.She says she feels good and is happy and feels she can lose the last 10 lbs on her on, has about 14 tabs at home.  Review of Systems Patient denies any headaches, hearing loss, fatigue, blurred vision, shortness of breath, chest pain, abdominal pain, problems with bowel movements, urination, or intercourse. No joint pain or mood swings. Reviewed past medical,surgical, social and family history. Reviewed medications and allergies.     Objective:   Physical Exam BP 120/70 mmHg  Pulse 72  Ht 5\' 3"  (1.6 m)  Wt 150 lb (68.04 kg)  BMI 26.58 kg/m2  LMP 12/26/2014 Skin warm and dry.  Lungs: clear to ausculation bilaterally. Cardiovascular: regular rate and rhythm.    Assessment:     Weight loss counseling BMI 26.58    Plan:     Continue weight loss efforts Follow up prn

## 2014-12-28 NOTE — Patient Instructions (Signed)
Continue weight loss efforts Follow up prn

## 2015-03-16 ENCOUNTER — Encounter: Payer: Self-pay | Admitting: Adult Health

## 2015-03-16 ENCOUNTER — Ambulatory Visit (INDEPENDENT_AMBULATORY_CARE_PROVIDER_SITE_OTHER): Payer: BLUE CROSS/BLUE SHIELD | Admitting: Adult Health

## 2015-03-16 VITALS — BP 138/60 | HR 90 | Ht 63.0 in | Wt 154.0 lb

## 2015-03-16 DIAGNOSIS — Z6827 Body mass index (BMI) 27.0-27.9, adult: Secondary | ICD-10-CM | POA: Diagnosis not present

## 2015-03-16 DIAGNOSIS — N920 Excessive and frequent menstruation with regular cycle: Secondary | ICD-10-CM | POA: Diagnosis not present

## 2015-03-16 DIAGNOSIS — N946 Dysmenorrhea, unspecified: Secondary | ICD-10-CM

## 2015-03-16 DIAGNOSIS — Z713 Dietary counseling and surveillance: Secondary | ICD-10-CM | POA: Diagnosis not present

## 2015-03-16 HISTORY — DX: Dysmenorrhea, unspecified: N94.6

## 2015-03-16 HISTORY — DX: Excessive and frequent menstruation with regular cycle: N92.0

## 2015-03-16 MED ORDER — PHENTERMINE HCL 37.5 MG PO CAPS: 37.5000 mg | ORAL_CAPSULE | ORAL | Status: DC

## 2015-03-16 NOTE — Progress Notes (Signed)
Subjective:     Patient ID: [name], female   DOB: Jan 22, 1981, 34 y.o.   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  Review of Systems Patient denies any headaches, hearing loss, fatigue, blurred vision, shortness of breath, chest pain, problems with bowel movements, urination, or intercourse. No joint pain or mood swings.See HPI for positives. Reviewed past medical,surgical, social and family history. Reviewed medications and allergies.     Objective:   Physical Exam BP 138/60 mmHg  Pulse 90  Ht 5\' 3"  (1.6 m)  Wt 154 lb (69.854 kg)  BMI 27.29 kg/m2  LMP 02/26/2015 Skin warm and dry. Neck: mid line trachea, normal thyroid, good ROM, no lymphadenopathy noted. Lungs: clear to ausculation bilaterally. Cardiovascular: regular rate and rhythm. Face time 15 minutes with 50 % counseling over ablation and restarting weight loss efforts.    Assessment:    Menorrhagia  Dysmenorrhea Weight loss counseling BMI 27.29     Plan:    Review handout on ablation Rx adipex 37.5 mg #30 take 1 daily, no refills Return 12/19 to talk surgery with Dr Glo Herring Return in 4 weeks for weight and BP check

## 2015-03-16 NOTE — Patient Instructions (Signed)

## 2015-03-29 ENCOUNTER — Other Ambulatory Visit: Payer: Self-pay | Admitting: Obstetrics and Gynecology

## 2015-03-29 ENCOUNTER — Encounter: Payer: Self-pay | Admitting: Obstetrics and Gynecology

## 2015-03-29 ENCOUNTER — Encounter (HOSPITAL_COMMUNITY)
Admission: RE | Admit: 2015-03-29 | Discharge: 2015-03-29 | Disposition: A | Discharge status: Home / Self Care | Payer: BLUE CROSS/BLUE SHIELD | Source: Ambulatory Visit | Attending: Obstetrics and Gynecology | Admitting: Obstetrics and Gynecology

## 2015-03-29 ENCOUNTER — Encounter (HOSPITAL_COMMUNITY): Payer: Self-pay

## 2015-03-29 ENCOUNTER — Ambulatory Visit (INDEPENDENT_AMBULATORY_CARE_PROVIDER_SITE_OTHER): Payer: BLUE CROSS/BLUE SHIELD | Admitting: Obstetrics and Gynecology

## 2015-03-29 VITALS — BP 110/60 | HR 74 | Ht 63.0 in | Wt 149.0 lb

## 2015-03-29 DIAGNOSIS — Z01818 Encounter for other preprocedural examination: Secondary | ICD-10-CM | POA: Diagnosis not present

## 2015-03-29 DIAGNOSIS — N63 Unspecified lump in breast: Secondary | ICD-10-CM | POA: Diagnosis not present

## 2015-03-29 DIAGNOSIS — Z8051 Family history of malignant neoplasm of kidney: Secondary | ICD-10-CM | POA: Diagnosis not present

## 2015-03-29 DIAGNOSIS — Z9851 Tubal ligation status: Secondary | ICD-10-CM | POA: Diagnosis not present

## 2015-03-29 DIAGNOSIS — Z801 Family history of malignant neoplasm of trachea, bronchus and lung: Secondary | ICD-10-CM | POA: Diagnosis not present

## 2015-03-29 DIAGNOSIS — N946 Dysmenorrhea, unspecified: Secondary | ICD-10-CM | POA: Diagnosis not present

## 2015-03-29 DIAGNOSIS — Z8379 Family history of other diseases of the digestive system: Secondary | ICD-10-CM | POA: Diagnosis not present

## 2015-03-29 DIAGNOSIS — Z8249 Family history of ischemic heart disease and other diseases of the circulatory system: Secondary | ICD-10-CM | POA: Diagnosis not present

## 2015-03-29 DIAGNOSIS — Z881 Allergy status to other antibiotic agents status: Secondary | ICD-10-CM | POA: Diagnosis not present

## 2015-03-29 DIAGNOSIS — Z9049 Acquired absence of other specified parts of digestive tract: Secondary | ICD-10-CM | POA: Diagnosis not present

## 2015-03-29 DIAGNOSIS — E669 Obesity, unspecified: Secondary | ICD-10-CM | POA: Diagnosis not present

## 2015-03-29 DIAGNOSIS — Z79899 Other long term (current) drug therapy: Secondary | ICD-10-CM | POA: Diagnosis not present

## 2015-03-29 DIAGNOSIS — Z833 Family history of diabetes mellitus: Secondary | ICD-10-CM | POA: Diagnosis not present

## 2015-03-29 DIAGNOSIS — N920 Excessive and frequent menstruation with regular cycle: Secondary | ICD-10-CM

## 2015-03-29 DIAGNOSIS — Z8261 Family history of arthritis: Secondary | ICD-10-CM | POA: Diagnosis not present

## 2015-03-29 LAB — BASIC METABOLIC PANEL
Anion gap: 9 (ref 5–15)
BUN: 7 mg/dL (ref 6–20)
CALCIUM: 9.6 mg/dL (ref 8.9–10.3)
CO2: 27 mmol/L (ref 22–32)
CREATININE: 0.58 mg/dL (ref 0.44–1.00)
Chloride: 106 mmol/L (ref 101–111)
GFR calc Af Amer: >60 mL/min (ref >60)
GLUCOSE: 82 mg/dL (ref 65–99)
Potassium: 3.9 mmol/L (ref 3.5–5.1)
SODIUM: 142 mmol/L (ref 135–145)

## 2015-03-29 LAB — HCG, SERUM, QUALITATIVE: PREG SERUM: NEGATIVE

## 2015-03-29 LAB — URINALYSIS, ROUTINE W REFLEX MICROSCOPIC
BILIRUBIN URINE: NEGATIVE
Glucose, UA: NEGATIVE mg/dL
HGB URINE DIPSTICK: NEGATIVE
KETONES UR: NEGATIVE mg/dL
Leukocytes, UA: NEGATIVE
NITRITE: NEGATIVE
PROTEIN: NEGATIVE mg/dL
SPECIFIC GRAVITY, URINE: 1.01 (ref 1.005–1.030)
pH: 7 (ref 5.0–8.0)

## 2015-03-29 LAB — CBC
HCT: 39.2 % (ref 36.0–46.0)
Hemoglobin: 12.5 g/dL (ref 12.0–15.0)
MCH: 24.6 pg — ABNORMAL LOW (ref 26.0–34.0)
MCHC: 31.9 g/dL (ref 30.0–36.0)
MCV: 77.2 fL — ABNORMAL LOW (ref 78.0–100.0)
PLATELETS: 296 10*3/uL (ref 150–400)
RBC: 5.08 MIL/uL (ref 3.87–5.11)
RDW: 14.1 % (ref 11.5–15.5)
WBC: 6.5 10*3/uL (ref 4.0–10.5)

## 2015-03-29 NOTE — Patient Instructions (Signed)
[name]  0000000     @PREFPERIOPPHARMACY @   Your procedure is scheduled on  03/31/2015   Report to [facility] at  615  A.M.  [place] this number if you have problems the morning of surgery:  [phone]   Remember:  Do not eat food or drink liquids after midnight.  Take these medicines the morning of surgery with A SIP OF WATER  none   Do not wear jewelry, make-up or nail polish.  Do not wear lotions, powders, or perfumes.  You may wear deodorant.  Do not shave 48 hours prior to surgery.  Men may shave face and neck.  Do not bring valuables to the hospital.  [facility] is not responsible for any belongings or valuables.  Contacts, dentures or bridgework may not be worn into surgery.  Leave your suitcase in the car.  After surgery it may be brought to your room.  For patients admitted to the hospital, discharge time will be determined by your treatment team.  Patients discharged the day of surgery will not be allowed to drive home.   Name and phone number of your driver:   family Special instructions:  none  Please read over the following fact sheets that you were given. Pain Booklet, Coughing and Deep Breathing, Surgical Site Infection Prevention, Anesthesia Post-op Instructions and Care and Recovery After Surgery      Hysteroscopy Hysteroscopy is a procedure used for looking inside the womb (uterus). It may be done for various reasons, including:  To evaluate abnormal bleeding, fibroid (benign, noncancerous) tumors, polyps, scar tissue (adhesions), and possibly cancer of the uterus.  To look for lumps (tumors) and other uterine growths.  To look for causes of why a woman cannot get pregnant (infertility), causes of recurrent loss of pregnancy (miscarriages), or a lost intrauterine device (IUD).  To perform a sterilization by blocking the fallopian tubes from inside the uterus. In this procedure, a thin, flexible tube with a tiny light and  camera on the end of it (hysteroscope) is used to look inside the uterus. A hysteroscopy should be done right after a menstrual period to be sure you are not pregnant. LET [facility] CARE PROVIDER KNOW ABOUT:   Any allergies you have.  All medicines you are taking, including vitamins, herbs, eye drops, creams, and over-the-counter medicines.  Previous problems you or members of your family have had with the use of anesthetics.  Any blood disorders you have.  Previous surgeries you have had.  Medical conditions you have. RISKS AND COMPLICATIONS  Generally, this is a safe procedure. However, as with any procedure, complications can occur. Possible complications include:  Putting a hole in the uterus.  Excessive bleeding.  Infection.  Damage to the cervix.  Injury to other organs.  Allergic reaction to medicines.  Too much fluid used in the uterus for the procedure. BEFORE THE PROCEDURE   Ask your health care provider about changing or stopping any regular medicines.  Do not take aspirin or blood thinners for 1 week before the procedure, or as directed by your health care provider. These can cause bleeding.  If you smoke, do not smoke for 2 weeks before the procedure.  In some cases, a medicine is placed in the cervix the day before the procedure. This medicine makes the cervix have a larger opening (dilate). This makes it easier for the instrument to be inserted into the uterus during the  procedure.  Do not eat or drink anything for at least 8 hours before the surgery.  Arrange for someone to take you home after the procedure. PROCEDURE   You may be given a medicine to relax you (sedative). You may also be given one of the following:  A medicine that numbs the area around the cervix (local anesthetic).  A medicine that makes you sleep through the procedure (general anesthetic).  The hysteroscope is inserted through the vagina into the uterus. The camera on the  hysteroscope sends a picture to a TV screen. This gives the surgeon a good view inside the uterus.  During the procedure, air or a liquid is put into the uterus, which allows the surgeon to see better.  Sometimes, tissue is gently scraped from inside the uterus. These tissue samples are sent to a lab for testing. AFTER THE PROCEDURE   If you had a general anesthetic, you may be groggy for a couple hours after the procedure.  If you had a local anesthetic, you will be able to go home as soon as you are stable and feel ready.  You may have some cramping. This normally lasts for a couple days.  You may have bleeding, which varies from light spotting for a few days to menstrual-like bleeding for 3-7 days. This is normal.  If your test results are not back during the visit, make an appointment with your health care provider to find out the results.   This information is not intended to replace advice given to you by your health care provider. Make sure you discuss any questions you have with your health care provider.   Document Released: 07/03/2000 Document Revised: 01/15/2013 Document Reviewed: 10/24/2012 Elsevier Interactive Patient Education 2016 [place]. Endometrial Ablation Endometrial ablation removes the lining of the uterus (endometrium). It is usually a same-day, outpatient treatment. Ablation helps avoid major surgery, such as surgery to remove the cervix and uterus (hysterectomy). After endometrial ablation, you will have little or no menstrual bleeding and may not be able to have children. However, if you are premenopausal, you will need to use a reliable method of birth control following the procedure because of the small chance that pregnancy can occur. There are different reasons to have this procedure. These reasons include:  Heavy periods.  Bleeding that is causing anemia.  Irregular bleeding.  Bleeding fibroids on the lining inside the uterus if they are smaller  than 3 centimeters. This procedure may not be possible for you if:   You want to have children in the future.   You have severe cramps with your menstrual period.   You have precancerous or cancerous cells in your uterus.   You were recently pregnant.   You have gone through menopause.   You have had major surgery on your uterus, resulting in thinning of the uterine wall. Surgeries may include:  The removal of one or more uterine fibroids (myomectomy).  A cesarean section with a classic (vertical) incision on your uterus. Ask your health care provider what type of cesarean you had. Sometimes the scar on your skin is different than the scar on your uterus. Even if you have had surgery on your uterus, certain types of ablation may still be safe for you. Talk with your health care provider. LET [facility] CARE PROVIDER KNOW ABOUT:  Any allergies you have.  All medicines you are taking, including vitamins, herbs, eye drops, creams, and over-the-counter medicines.  Previous problems you or members of  your family have had with the use of anesthetics.  Any blood disorders you have.  Previous surgeries you have had.  Medical conditions you have. RISKS AND COMPLICATIONS  Generally, this is a safe procedure. However, as with any procedure, complications can occur. Possible complications include:  Perforation of the uterus.  Bleeding.  Infection of the uterus, bladder, or vagina.  Injury to surrounding organs.  An air bubble to the lung (air embolus).  Pregnancy following the procedure.  Failure of the procedure to help the problem, requiring hysterectomy.  Decreased ability to diagnose cancer in the lining of the uterus. BEFORE THE PROCEDURE  The lining of the uterus must be tested to make sure there is no pre-cancerous or cancer cells present.  An ultrasound may be performed to look at the size of the uterus and to check for abnormalities.  Medicines may be given  to thin the lining of the uterus. PROCEDURE  During the procedure, your health care provider will use a tool called a resectoscope to help see inside your uterus. There are different ways to remove the lining of your uterus.   Radiofrequency - This method uses a radiofrequency-alternating electric current to remove the lining of the uterus.  Cryotherapy - This method uses extreme cold to freeze the lining of the uterus.  Heated-Free Liquid - This method uses heated salt (saline) solution to remove the lining of the uterus.  Microwave - This method uses high-energy microwaves to heat up the lining of the uterus to remove it.  Thermal balloon - This method involves inserting a catheter with a balloon tip into the uterus. The balloon tip is filled with heated fluid to remove the lining of the uterus. AFTER THE PROCEDURE  After your procedure, do not have sexual intercourse or insert anything into your vagina until permitted by your health care provider. After the procedure, you may experience:  Cramps.  Vaginal discharge.  Frequent urination.   This information is not intended to replace advice given to you by your health care provider. Make sure you discuss any questions you have with your health care provider.   Document Released: 02/04/2004 Document Revised: 12/16/2014 Document Reviewed: 08/28/2012 Elsevier Interactive Patient Education 2016 [employer] Dilation and Curettage or Vacuum Curettage Dilation and curettage (D&C) and vacuum curettage are minor procedures. A D&C involves stretching (dilation) the cervix and scraping (curettage) the inside lining of the womb (uterus). During a D&C, tissue is gently scraped from the inside lining of the uterus. During a vacuum curettage, the lining and tissue in the uterus are removed with the use of gentle suction.  Curettage may be performed to either diagnose or treat a problem. As a diagnostic procedure, curettage is performed to examine  tissues from the uterus. A diagnostic curettage may be performed for the following symptoms:   Irregular bleeding in the uterus.   Bleeding with the development of clots.   Spotting between menstrual periods.   Prolonged menstrual periods.   Bleeding after menopause.   No menstrual period (amenorrhea).   A change in size and shape of the uterus.  As a treatment procedure, curettage may be performed for the following reasons:   Removal of an IUD (intrauterine device).   Removal of retained placenta after giving birth. Retained placenta can cause an infection or bleeding severe enough to require transfusions.   Abortion.   Miscarriage.   Removal of polyps inside the uterus.   Removal of uncommon types of noncancerous lumps (fibroids).  LET [facility] CARE PROVIDER KNOW ABOUT:   Any allergies you have.   All medicines you are taking, including vitamins, herbs, eye drops, creams, and over-the-counter medicines.   Previous problems you or members of your family have had with the use of anesthetics.   Any blood disorders you have.   Previous surgeries you have had.   Medical conditions you have. RISKS AND COMPLICATIONS  Generally, this is a safe procedure. However, as with any procedure, complications can occur. Possible complications include:  Excessive bleeding.   Infection of the uterus.   Damage to the cervix.   Development of scar tissue (adhesions) inside the uterus, later causing abnormal amounts of menstrual bleeding.   Complications from the general anesthetic, if a general anesthetic is used.   Putting a hole (perforation) in the uterus. This is rare.  BEFORE THE PROCEDURE   Eat and drink before the procedure only as directed by your health care provider.   Arrange for someone to take you home.  PROCEDURE  This procedure usually takes about 15-30 minutes.  You will be given one of the following:  A medicine that numbs the  area in and around the cervix (local anesthetic).   A medicine to make you sleep through the procedure (general anesthetic).  You will lie on your back with your legs in stirrups.   A warm metal or plastic instrument (speculum) will be placed in your vagina to keep it open and to allow the health care provider to see the cervix.  There are two ways in which your cervix can be softened and dilated. These include:   Taking a medicine.   Having thin rods (laminaria) inserted into your cervix.   A curved tool (curette) will be used to scrape cells from the inside lining of the uterus. In some cases, gentle suction is applied with the curette. The curette will then be removed.  AFTER THE PROCEDURE   You will rest in the recovery area until you are stable and are ready to go home.   You may feel sick to your stomach (nauseous) or throw up (vomit) if you were given a general anesthetic.   You may have a sore throat if a tube was placed in your throat during general anesthesia.   You may have light cramping and bleeding. This may last for 2 days to 2 weeks after the procedure.   Your uterus needs to make a new lining after the procedure. This may make your next period late.   This information is not intended to replace advice given to you by your health care provider. Make sure you discuss any questions you have with your health care provider.   Document Released: 03/27/2005 Document Revised: 11/27/2012 Document Reviewed: 10/24/2012 Elsevier Interactive Patient Education 2016 [employer] PATIENT INSTRUCTIONS POST-ANESTHESIA  IMMEDIATELY FOLLOWING SURGERY:  Do not drive or operate machinery for the first twenty four hours after surgery.  Do not make any important decisions for twenty four hours after surgery or while taking narcotic pain medications or sedatives.  If you develop intractable nausea and vomiting or a severe headache please notify your doctor  immediately.  FOLLOW-UP:  Please make an appointment with your surgeon as instructed. You do not need to follow up with anesthesia unless specifically instructed to do so.  WOUND CARE INSTRUCTIONS (if applicable):  Keep a dry clean dressing on the anesthesia/puncture wound site if there is drainage.  Once the wound has quit draining you may leave  it open to air.  Generally you should leave the bandage intact for twenty four hours unless there is drainage.  If the epidural site drains for more than 36-48 hours please Linch the anesthesia department.  QUESTIONS?:  Please feel free to [place] your physician or the hospital operator if you have any questions, and they will be happy to assist you.

## 2015-03-29 NOTE — Patient Instructions (Signed)
Hysteroscopy °Hysteroscopy is a procedure used for looking inside the womb (uterus). It may be done for various reasons, including: °· To evaluate abnormal bleeding, fibroid (benign, noncancerous) tumors, polyps, scar tissue (adhesions), and possibly cancer of the uterus. °· To look for lumps (tumors) and other uterine growths. °· To look for causes of why a woman cannot get pregnant (infertility), causes of recurrent loss of pregnancy (miscarriages), or a lost intrauterine device (IUD). °· To perform a sterilization by blocking the fallopian tubes from inside the uterus. °In this procedure, a thin, flexible tube with a tiny light and camera on the end of it (hysteroscope) is used to look inside the uterus. A hysteroscopy should be done right after a menstrual period to be sure you are not pregnant. °LET YOUR HEALTH CARE PROVIDER KNOW ABOUT:  °· Any allergies you have. °· All medicines you are taking, including vitamins, herbs, eye drops, creams, and over-the-counter medicines. °· Previous problems you or members of your family have had with the use of anesthetics. °· Any blood disorders you have. °· Previous surgeries you have had. °· Medical conditions you have. °RISKS AND COMPLICATIONS  °Generally, this is a safe procedure. However, as with any procedure, complications can occur. Possible complications include: °· Putting a hole in the uterus. °· Excessive bleeding. °· Infection. °· Damage to the cervix. °· Injury to other organs. °· Allergic reaction to medicines. °· Too much fluid used in the uterus for the procedure. °BEFORE THE PROCEDURE  °· Ask your health care provider about changing or stopping any regular medicines. °· Do not take aspirin or blood thinners for 1 week before the procedure, or as directed by your health care provider. These can cause bleeding. °· If you smoke, do not smoke for 2 weeks before the procedure. °· In some cases, a medicine is placed in the cervix the day before the procedure.  This medicine makes the cervix have a larger opening (dilate). This makes it easier for the instrument to be inserted into the uterus during the procedure. °· Do not eat or drink anything for at least 8 hours before the surgery. °· Arrange for someone to take you home after the procedure. °PROCEDURE  °· You may be given a medicine to relax you (sedative). You may also be given one of the following: °¨ A medicine that numbs the area around the cervix (local anesthetic). °¨ A medicine that makes you sleep through the procedure (general anesthetic). °· The hysteroscope is inserted through the vagina into the uterus. The camera on the hysteroscope sends a picture to a TV screen. This gives the surgeon a good view inside the uterus. °· During the procedure, air or a liquid is put into the uterus, which allows the surgeon to see better. °· Sometimes, tissue is gently scraped from inside the uterus. These tissue samples are sent to a lab for testing. °AFTER THE PROCEDURE  °· If you had a general anesthetic, you may be groggy for a couple hours after the procedure. °· If you had a local anesthetic, you will be able to go home as soon as you are stable and feel ready. °· You may have some cramping. This normally lasts for a couple days. °· You may have bleeding, which varies from light spotting for a few days to menstrual-like bleeding for 3-7 days. This is normal. °· If your test results are not back during the visit, make an appointment with your health care provider to find out the   results.   This information is not intended to replace advice given to you by your health care provider. Make sure you discuss any questions you have with your health care provider.   Document Released: 07/03/2000 Document Revised: 01/15/2013 Document Reviewed: 10/24/2012 Elsevier Interactive Patient Education 2016 [employer]   

## 2015-03-29 NOTE — Pre-Procedure Instructions (Signed)
Patient given information to sign up for my chart at home. 

## 2015-03-29 NOTE — H&P (Signed)
Patient ID: [name], female DOB: 10-31-1980, 34 y.o. MRN: [id number]   Preoperative History and Physical  [name] is a 34 y.o. G4P4 with h/o tubal ligation, menorrhagia and dysmenorrhea, here for surgical management of hysteroscopy D&C, endometrial ablation. No significant preoperative concerns. Pt states she previously had an IUD taken out due to increased pain. Pt reports heavy menses, lasting several days, s/p tubal ligation. She notes that she just finished her period. She denies any new symptoms.   Proposed surgery: hysteroscopy D&C, endometrial ablation  Past Medical History  Diagnosis Date  . Gestational diabetes   . Breast nodule 11/27/2013    Has firm mobile nodule at 11 0' clock 2 fb from areola and a tender nodule at 1 o'clock 5 fb in right breast  . Obesity 09/25/2014  . Menorrhagia with regular cycle 03/16/2015  . Dysmenorrhea 03/16/2015   Past Surgical History  Procedure Laterality Date  . Tubal ligation    . Cholecystectomy    . I&d left breast      mastitis   OB History  Gravida Para Term Preterm AB SAB TAB Ectopic Multiple Living  4 4        4     # Outcome Date GA Lbr Len/2nd Weight Sex Delivery Anes PTL Lv  4 Para           3 Para           2 Para           1 Para             Patient denies any other pertinent gynecologic issues.   Current Outpatient Prescriptions on File Prior to Visit  Medication Sig Dispense Refill  . phentermine 37.5 MG capsule Take 1 capsule (37.5 mg total) by mouth every morning. 30 capsule 0   No current facility-administered medications on file prior to visit.   Allergies  Allergen Reactions  . Keflex [Cephalexin]     Social History:  reports that she has never smoked. She has never used smokeless tobacco. She reports that she does not drink alcohol or use illicit  drugs.  Family History  Problem Relation Age of Onset  . Diabetes Mother   . Hypertension Mother   . Cancer Maternal Grandmother     kidney  . Coronary artery disease Maternal Grandmother   . Cancer Maternal Grandfather     lung,brain  . Coronary artery disease Maternal Grandfather   . Diabetes Paternal Grandmother   . Other Paternal Grandfather     myasthenia gravis  . Arthritis Brother   . Other Brother     crohns disease    Review of Systems: Noncontributory  PHYSICAL EXAM: Blood pressure 110/60, pulse 74, height 5\' 3"  (1.6 m), weight 149 lb (67.586 kg), last menstrual period 03/19/2015. General appearance - alert, well appearing, and in no distress Chest - clear to auscultation, no wheezes, rales or rhonchi, symmetric air entry Heart - normal rate and regular rhythm Abdomen - soft, nontender, nondistended, no masses or organomegaly Pelvic - normal external genitalia, vulva, vagina, cervix, uterus and adnexa Vulva- normal appearing vulva with no masses, tenderness or lesions VAGINA: normal appearing vagina with normal color and discharge, no lesions CERVIX: normal appearing cervix without discharge or lesions UTERUS: uterus is normal size, shape, consistency and nontender; anteverted  ADNEXA: normal adnexa in size, nontender and no masses.  Extremities - peripheral pulses normal, no pedal edema, no clubbing or cyanosis  Labs: No results found  for this or any previous visit (from the past 336 hour(s)).  Imaging Studies:  Imaging Results    No results found.    Assessment:  1. Menorrhagia s/p tubal ligation  2. Collected GC/CHL   Patient Active Problem List   Diagnosis Date Noted  . Menorrhagia with regular cycle 03/16/2015  . Dysmenorrhea 03/16/2015  . Obesity 09/25/2014  . Breast nodule 11/27/2013    Plan: Patient will undergo surgical management with hysteroscopy D&C,  endometrial ablation.    .mec 03/29/2015 9:45 AM   By signing my name below, I, Hansel Feinstein, attest that this documentation has been prepared under the direction and in the presence of [name], MD. Electronically Signed: Hansel Feinstein, ED Scribe. 03/29/2015. 9:36 AM.       I personally performed the services described in this documentation, which was SCRIBED in my presence. The recorded information has been reviewed and considered accurate. It has been edited as necessary during review. [name], MD

## 2015-03-29 NOTE — Progress Notes (Signed)
Patient ID: [name], female   DOB: 1981-03-26, 34 y.o.   MRN: [id number]   Preoperative History and Physical  Auriyah Lush Slager is a 34 y.o. G4P4 with h/o tubal ligation, menorrhagia and dysmenorrhea, here for surgical management of hysteroscopy D&C, endometrial ablation. No significant preoperative concerns. Pt states she previously had an IUD taken out due to increased pain. Pt reports heavy menses, lasting several days, s/p tubal ligation. She notes that she just finished her period. She denies any new symptoms.   Proposed surgery: hysteroscopy D&C, endometrial ablation  Past Medical History  Diagnosis Date  . Gestational diabetes   . Breast nodule 11/27/2013    Has firm mobile nodule at 11 0' clock 2 fb from areola and a tender nodule at 1 o'clock  5 fb in right breast  . Obesity 09/25/2014  . Menorrhagia with regular cycle 03/16/2015  . Dysmenorrhea 03/16/2015   Past Surgical History  Procedure Laterality Date  . Tubal ligation    . Cholecystectomy    . I&d left breast      mastitis   OB History  Gravida Para Term Preterm AB SAB TAB Ectopic Multiple Living  4 4        4     # Outcome Date GA Lbr Len/2nd Weight Sex Delivery Anes PTL Lv  4 Para           3 Para           2 Para           1 Para             Patient denies any other pertinent gynecologic issues.   Current Outpatient Prescriptions on File Prior to Visit  Medication Sig Dispense Refill  . phentermine 37.5 MG capsule Take 1 capsule (37.5 mg total) by mouth every morning. 30 capsule 0   No current facility-administered medications on file prior to visit.   Allergies  Allergen Reactions  . Keflex [Cephalexin]     Social History:   reports that she has never smoked. She has never used smokeless tobacco. She reports that she does not drink alcohol or use illicit drugs.  Family History  Problem Relation Age of Onset  . Diabetes Mother   . Hypertension Mother   . Cancer Maternal Grandmother     kidney  .  Coronary artery disease Maternal Grandmother   . Cancer Maternal Grandfather     lung,brain  . Coronary artery disease Maternal Grandfather   . Diabetes Paternal Grandmother   . Other Paternal Grandfather     myasthenia gravis  . Arthritis Brother   . Other Brother     crohns disease    Review of Systems: Noncontributory  PHYSICAL EXAM: Blood pressure 110/60, pulse 74, height 5\' 3"  (1.6 m), weight 149 lb (67.586 kg), last menstrual period 03/19/2015. General appearance - alert, well appearing, and in no distress Chest - clear to auscultation, no wheezes, rales or rhonchi, symmetric air entry Heart - normal rate and regular rhythm Abdomen - soft, nontender, nondistended, no masses or organomegaly Pelvic - normal external genitalia, vulva, vagina, cervix, uterus and adnexa Vulva- normal appearing vulva with no masses, tenderness or lesions VAGINA: normal appearing vagina with normal color and discharge, no lesions CERVIX: normal appearing cervix without discharge or lesions UTERUS: uterus is normal size, shape, consistency and nontender; anteverted  ADNEXA: normal adnexa in size, nontender and no masses.  Extremities - peripheral pulses normal, no pedal edema, no clubbing or  cyanosis  Labs: No results found for this or any previous visit (from the past 336 hour(s)).  Imaging Studies: No results found.  Assessment:  1. Menorrhagia s/p tubal ligation   pt requesting endometrial ablation , desires before end of year 2. Collected GC/CHL  3 Schedule Hysteroscopy dilation and curettage, endometrial ablation 03/31/15 Patient Active Problem List   Diagnosis Date Noted  . Menorrhagia with regular cycle 03/16/2015  . Dysmenorrhea 03/16/2015  . Obesity 09/25/2014  . Breast nodule 11/27/2013    Plan: Patient will undergo surgical management with hysteroscopy D&C, endometrial ablation. 1221/16   .mec 03/29/2015 9:45 AM   By signing my name below, I, Hansel Feinstein, attest that  this documentation has been prepared under the direction and in the presence of [name], MD. Electronically Signed: Hansel Feinstein, ED Scribe. 03/29/2015. 9:36 AM.  I personally performed the services described in this documentation, which was SCRIBED in my presence. The recorded information has been reviewed and considered accurate. It has been edited as necessary during review. [name], MD

## 2015-03-30 LAB — GC/CHLAMYDIA PROBE AMP
CHLAMYDIA, DNA PROBE: NEGATIVE
NEISSERIA GONORRHOEAE BY PCR: NEGATIVE

## 2015-03-31 ENCOUNTER — Ambulatory Visit (HOSPITAL_COMMUNITY): Payer: BLUE CROSS/BLUE SHIELD | Admitting: Anesthesiology

## 2015-03-31 ENCOUNTER — Encounter (HOSPITAL_COMMUNITY)
Admission: RE | Disposition: A | Discharge status: Home / Self Care | Payer: Self-pay | Source: Ambulatory Visit | Attending: Obstetrics and Gynecology

## 2015-03-31 ENCOUNTER — Ambulatory Visit (HOSPITAL_COMMUNITY)
Admission: RE | Admit: 2015-03-31 | Discharge: 2015-03-31 | Disposition: A | Discharge status: Home / Self Care | Payer: BLUE CROSS/BLUE SHIELD | Source: Ambulatory Visit | Attending: Obstetrics and Gynecology | Admitting: Obstetrics and Gynecology

## 2015-03-31 ENCOUNTER — Encounter (HOSPITAL_COMMUNITY): Payer: Self-pay | Admitting: *Deleted

## 2015-03-31 DIAGNOSIS — Z79899 Other long term (current) drug therapy: Secondary | ICD-10-CM | POA: Insufficient documentation

## 2015-03-31 DIAGNOSIS — N63 Unspecified lump in breast: Secondary | ICD-10-CM | POA: Insufficient documentation

## 2015-03-31 DIAGNOSIS — N920 Excessive and frequent menstruation with regular cycle: Secondary | ICD-10-CM | POA: Diagnosis present

## 2015-03-31 DIAGNOSIS — E669 Obesity, unspecified: Secondary | ICD-10-CM | POA: Insufficient documentation

## 2015-03-31 DIAGNOSIS — Z8261 Family history of arthritis: Secondary | ICD-10-CM | POA: Insufficient documentation

## 2015-03-31 DIAGNOSIS — Z801 Family history of malignant neoplasm of trachea, bronchus and lung: Secondary | ICD-10-CM | POA: Insufficient documentation

## 2015-03-31 DIAGNOSIS — Z8249 Family history of ischemic heart disease and other diseases of the circulatory system: Secondary | ICD-10-CM | POA: Insufficient documentation

## 2015-03-31 DIAGNOSIS — N946 Dysmenorrhea, unspecified: Secondary | ICD-10-CM | POA: Diagnosis present

## 2015-03-31 DIAGNOSIS — Z9851 Tubal ligation status: Secondary | ICD-10-CM | POA: Insufficient documentation

## 2015-03-31 DIAGNOSIS — Z833 Family history of diabetes mellitus: Secondary | ICD-10-CM | POA: Insufficient documentation

## 2015-03-31 DIAGNOSIS — Z9049 Acquired absence of other specified parts of digestive tract: Secondary | ICD-10-CM | POA: Insufficient documentation

## 2015-03-31 DIAGNOSIS — Z8051 Family history of malignant neoplasm of kidney: Secondary | ICD-10-CM | POA: Insufficient documentation

## 2015-03-31 DIAGNOSIS — Z881 Allergy status to other antibiotic agents status: Secondary | ICD-10-CM | POA: Insufficient documentation

## 2015-03-31 DIAGNOSIS — Z8379 Family history of other diseases of the digestive system: Secondary | ICD-10-CM | POA: Insufficient documentation

## 2015-03-31 HISTORY — PX: DILITATION & CURRETTAGE/HYSTROSCOPY WITH NOVASURE ABLATION: SHX5568

## 2015-03-31 SURGERY — DILATATION & CURETTAGE/HYSTEROSCOPY WITH NOVASURE ABLATION
Anesthesia: General | Site: Vagina

## 2015-03-31 MED ORDER — BUPIVACAINE HCL (PF) 0.5 % IJ SOLN
INTRAMUSCULAR | Status: AC
  Filled 2015-03-31: qty 30

## 2015-03-31 MED ORDER — FENTANYL CITRATE (PF) 100 MCG/2ML IJ SOLN
INTRAMUSCULAR | Status: DC | PRN
  Administered 2015-03-31: 50 ug via INTRAVENOUS

## 2015-03-31 MED ORDER — BUPIVACAINE-EPINEPHRINE (PF) 0.5% -1:200000 IJ SOLN
INTRAMUSCULAR | Status: AC
  Filled 2015-03-31: qty 30

## 2015-03-31 MED ORDER — MIDAZOLAM HCL 5 MG/5ML IJ SOLN
INTRAMUSCULAR | Status: DC | PRN
  Administered 2015-03-31: 2 mg via INTRAVENOUS

## 2015-03-31 MED ORDER — DEXAMETHASONE SODIUM PHOSPHATE 4 MG/ML IJ SOLN
4.0000 mg | Freq: Once | INTRAMUSCULAR | Status: AC
  Administered 2015-03-31: 4 mg via INTRAVENOUS

## 2015-03-31 MED ORDER — ONDANSETRON HCL 4 MG/2ML IJ SOLN
INTRAMUSCULAR | Status: AC
  Filled 2015-03-31: qty 2

## 2015-03-31 MED ORDER — FENTANYL CITRATE (PF) 100 MCG/2ML IJ SOLN: 25.0000 ug | INTRAMUSCULAR | Status: DC | PRN

## 2015-03-31 MED ORDER — FENTANYL CITRATE (PF) 100 MCG/2ML IJ SOLN
INTRAMUSCULAR | Status: AC
  Filled 2015-03-31: qty 2

## 2015-03-31 MED ORDER — MIDAZOLAM HCL 2 MG/2ML IJ SOLN
INTRAMUSCULAR | Status: AC
  Filled 2015-03-31: qty 2

## 2015-03-31 MED ORDER — LACTATED RINGERS IV SOLN
INTRAVENOUS | Status: DC
  Administered 2015-03-31: 07:00:00 via INTRAVENOUS

## 2015-03-31 MED ORDER — BUPIVACAINE-EPINEPHRINE 0.5% -1:200000 IJ SOLN
INTRAMUSCULAR | Status: DC | PRN
  Administered 2015-03-31: 22 mL

## 2015-03-31 MED ORDER — CEFAZOLIN SODIUM-DEXTROSE 2-3 GM-% IV SOLR
2.0000 g | INTRAVENOUS | Status: AC
  Administered 2015-03-31: 2 g via INTRAVENOUS

## 2015-03-31 MED ORDER — ONDANSETRON HCL 4 MG/2ML IJ SOLN: 4.0000 mg | Freq: Once | INTRAMUSCULAR | Status: DC | PRN

## 2015-03-31 MED ORDER — PROPOFOL 10 MG/ML IV BOLUS
INTRAVENOUS | Status: AC
  Filled 2015-03-31: qty 20

## 2015-03-31 MED ORDER — DEXAMETHASONE SODIUM PHOSPHATE 4 MG/ML IJ SOLN
INTRAMUSCULAR | Status: AC
  Filled 2015-03-31: qty 1

## 2015-03-31 MED ORDER — FENTANYL CITRATE (PF) 100 MCG/2ML IJ SOLN
25.0000 ug | INTRAMUSCULAR | Status: AC
  Administered 2015-03-31: 25 ug via INTRAVENOUS

## 2015-03-31 MED ORDER — PROPOFOL 10 MG/ML IV BOLUS
INTRAVENOUS | Status: DC | PRN
  Administered 2015-03-31: 120 mg via INTRAVENOUS

## 2015-03-31 MED ORDER — OXYCODONE-ACETAMINOPHEN 5-325 MG PO TABS: 1.0000 | ORAL_TABLET | ORAL | Status: DC | PRN

## 2015-03-31 MED ORDER — MIDAZOLAM HCL 2 MG/2ML IJ SOLN
1.0000 mg | INTRAMUSCULAR | Status: DC | PRN
  Administered 2015-03-31: 2 mg via INTRAVENOUS

## 2015-03-31 MED ORDER — LIDOCAINE HCL 1 % IJ SOLN
INTRAMUSCULAR | Status: DC | PRN
  Administered 2015-03-31: 30 mg via INTRADERMAL

## 2015-03-31 MED ORDER — CEFAZOLIN SODIUM-DEXTROSE 2-3 GM-% IV SOLR
INTRAVENOUS | Status: AC
  Filled 2015-03-31: qty 50

## 2015-03-31 MED ORDER — KETOROLAC TROMETHAMINE 10 MG PO TABS: 10.0000 mg | ORAL_TABLET | Freq: Four times a day (QID) | ORAL | Status: DC

## 2015-03-31 MED ORDER — SODIUM CHLORIDE 0.9 % IR SOLN
Status: DC | PRN
  Administered 2015-03-31: 1000 mL

## 2015-03-31 MED ORDER — ONDANSETRON HCL 4 MG/2ML IJ SOLN
4.0000 mg | Freq: Once | INTRAMUSCULAR | Status: AC
  Administered 2015-03-31: 4 mg via INTRAVENOUS

## 2015-03-31 SURGICAL SUPPLY — 30 items
ABLATOR ENDOMETRIAL BIPOLAR (ABLATOR) ×3 IMPLANT
BAG HAMPER (MISCELLANEOUS) ×3 IMPLANT
CANISTER SUCT 3000ML PPV (MISCELLANEOUS) ×1 IMPLANT
CATH ROBINSON RED A/P 16FR (CATHETERS) ×3 IMPLANT
CLOTH BEACON ORANGE TIMEOUT ST (SAFETY) ×3 IMPLANT
COVER LIGHT HANDLE STERIS (MISCELLANEOUS) ×6 IMPLANT
DECANTER SPIKE VIAL GLASS SM (MISCELLANEOUS) ×3 IMPLANT
FORMALIN 10 PREFIL 120ML (MISCELLANEOUS) ×3 IMPLANT
GLOVE BIOGEL M 7.0 STRL (GLOVE) ×2 IMPLANT
GLOVE BIOGEL PI IND STRL 7.0 (GLOVE) ×1 IMPLANT
GLOVE BIOGEL PI IND STRL 9 (GLOVE) ×1 IMPLANT
GLOVE BIOGEL PI INDICATOR 7.0 (GLOVE) ×2
GLOVE BIOGEL PI INDICATOR 9 (GLOVE) ×2
GLOVE ECLIPSE 9.0 STRL (GLOVE) ×3 IMPLANT
GLOVE EXAM NITRILE MD LF STRL (GLOVE) ×2 IMPLANT
GOWN SPEC L3 XXLG W/TWL (GOWN DISPOSABLE) ×3 IMPLANT
GOWN STRL REUS W/TWL LRG LVL3 (GOWN DISPOSABLE) ×3 IMPLANT
INST SET HYSTEROSCOPY (KITS) ×3 IMPLANT
IV NS 1000ML (IV SOLUTION) ×3
IV NS 1000ML BAXH (IV SOLUTION) ×1 IMPLANT
KIT ROOM TURNOVER AP CYSTO (KITS) ×3 IMPLANT
KIT ROOM TURNOVER APOR (KITS) ×3 IMPLANT
MANIFOLD NEPTUNE II (INSTRUMENTS) ×3 IMPLANT
NS IRRIG 1000ML POUR BTL (IV SOLUTION) ×3 IMPLANT
PACK PERI GYN (CUSTOM PROCEDURE TRAY) ×3 IMPLANT
PAD ARMBOARD 7.5X6 YLW CONV (MISCELLANEOUS) ×3 IMPLANT
PAD TELFA 3X4 1S STER (GAUZE/BANDAGES/DRESSINGS) ×3 IMPLANT
SET BASIN LINEN APH (SET/KITS/TRAYS/PACK) ×3 IMPLANT
SET IRRIG Y TYPE TUR BLADDER L (SET/KITS/TRAYS/PACK) ×3 IMPLANT
SYR CONTROL 10ML LL (SYRINGE) ×3 IMPLANT

## 2015-03-31 NOTE — Brief Op Note (Signed)
03/31/2015  [id number] AM  PATIENT:  [name]  34 y.o. female  PRE-OPERATIVE DIAGNOSIS:  menorrhagia  POST-OPERATIVE DIAGNOSIS:  menorrhagia  PROCEDURE:  Procedure(s): DILATATION & CURETTAGE/HYSTEROSCOPY WITH NOVASURE ABLATION; NOVASURE UTERINE CAVITY LENGTH 4.7 CM, WIDTH 3.7 CM, POWER 96 WATTS, TIME 2 MINUTES (N/A)  SURGEON:  Surgeon(s) and Role:    * [name], MD - Primary  PHYSICIAN ASSISTANT:   ASSISTANTS: none   ANESTHESIA:   general and paracervical block  EBL:  Total I/O In: 400 [I.V.:400] Out: 200 [Urine:200]  BLOOD ADMINISTERED:none  DRAINS: none   LOCAL MEDICATIONS USED:  MARCAINE    and Amount: 20 ml  SPECIMEN:  No Specimen  DISPOSITION OF SPECIMEN:  N/A  COUNTS:  YES  TOURNIQUET:  * No tourniquets in log *  DICTATION: .Dragon Dictation  PLAN OF CARE: Discharge to home after PACU  PATIENT DISPOSITION:  PACU - hemodynamically stable.   Delay start of Pharmacological VTE agent (>24hrs) due to surgical blood loss or risk of bleeding: not applicable Details of procedure: Patient was taken operating room prepped and draped for vaginal procedure timeout was conducted. Ancef was administered. , And procedure confirmed by surgical team. Cervix was grasped with single-tooth tenaculum with speculum in place,, paracervical block applied, 10 cc on each side, uterus sounded to 10 cm dilated to 25 [place] allowing introduction of the 30 operative hysteroscope identifying a smooth atrophic endometrial cavity consistent with early follicular phase,. Tubal ostia were visualized. There was no suspicion of endometrial abnormality. Smooth sharp curettage performed obtained essentially no tissue. The NovaSure endometrial ablation device was prepared, inserted, activated for a 2 minute procedure, then equipment removed. Patient tolerated procedure well and will go recovery room in stable condition.

## 2015-03-31 NOTE — Discharge Instructions (Signed)
Endometrial Ablation °Endometrial ablation removes the lining of the uterus (endometrium). It is usually a same-day, outpatient treatment. Ablation helps avoid major surgery, such as surgery to remove the cervix and uterus (hysterectomy). After endometrial ablation, you will have little or no menstrual bleeding and may not be able to have children. However, if you are premenopausal, you will need to use a reliable method of birth control following the procedure because of the small chance that pregnancy can occur. °There are different reasons to have this procedure. These reasons include: °· Heavy periods. °· Bleeding that is causing anemia. °· Irregular bleeding. °· Bleeding fibroids on the lining inside the uterus if they are smaller than 3 centimeters. °This procedure may not be possible for you if:  °· You want to have children in the future.   °· You have severe cramps with your menstrual period.   °· You have precancerous or cancerous cells in your uterus.   °· You were recently pregnant.   °· You have gone through menopause.   °· You have had major surgery on your uterus, resulting in thinning of the uterine wall. Surgeries may include: °¨ The removal of one or more uterine fibroids (myomectomy). °¨ A cesarean section with a classic (vertical) incision on your uterus. Ask your health care provider what type of cesarean you had. Sometimes the scar on your skin is different than the scar on your uterus. °Even if you have had surgery on your uterus, certain types of ablation may still be safe for you. Talk with your health care provider. °LET YOUR HEALTH CARE PROVIDER KNOW ABOUT: °· Any allergies you have. °· All medicines you are taking, including vitamins, herbs, eye drops, creams, and over-the-counter medicines. °· Previous problems you or members of your family have had with the use of anesthetics. °· Any blood disorders you have. °· Previous surgeries you have had. °· Medical conditions you have. °RISKS AND  COMPLICATIONS  °Generally, this is a safe procedure. However, as with any procedure, complications can occur. Possible complications include: °· Perforation of the uterus. °· Bleeding. °· Infection of the uterus, bladder, or vagina. °· Injury to surrounding organs. °· An air bubble to the lung (air embolus). °· Pregnancy following the procedure. °· Failure of the procedure to help the problem, requiring hysterectomy. °· Decreased ability to diagnose cancer in the lining of the uterus. °BEFORE THE PROCEDURE °· The lining of the uterus must be tested to make sure there is no pre-cancerous or cancer cells present. °· An ultrasound may be performed to look at the size of the uterus and to check for abnormalities. °· Medicines may be given to thin the lining of the uterus. °PROCEDURE  °During the procedure, your health care provider will use a tool called a resectoscope to help see inside your uterus. There are different ways to remove the lining of your uterus.  °· Radiofrequency - This method uses a radiofrequency-alternating electric current to remove the lining of the uterus. °· Cryotherapy - This method uses extreme cold to freeze the lining of the uterus. °· Heated-Free Liquid - This method uses heated salt (saline) solution to remove the lining of the uterus. °· Microwave - This method uses high-energy microwaves to heat up the lining of the uterus to remove it. °· Thermal balloon - This method involves inserting a catheter with a balloon tip into the uterus. The balloon tip is filled with heated fluid to remove the lining of the uterus. °AFTER THE PROCEDURE  °After your procedure, do   not have sexual intercourse or insert anything into your vagina until permitted by your health care provider. After the procedure, you may experience: °· Cramps. °· Vaginal discharge. °· Frequent urination. °  °This information is not intended to replace advice given to you by your health care provider. Make sure you discuss any  questions you have with your health care provider. °  °Document Released: 02/04/2004 Document Revised: 12/16/2014 Document Reviewed: 08/28/2012 °Elsevier Interactive Patient Education ©2016 Else[employer] Inc. ° °

## 2015-03-31 NOTE — Interval H&P Note (Signed)
History and Physical Interval Note:  [id number] AM  [name]  has presented today for surgery, with the diagnosis of menorrhagia  The various methods of treatment have been discussed with the patient and family. After consideration of risks, benefits and other options for treatment, the patient has consented to  Procedure(s): [place] (N/A) as a surgical intervention .  The patient's history has been reviewed, patient examined, no change in status, stable for surgery.  I have reviewed the patient's chart and labs.  Questions were answered to the patient's satisfaction.     [name]

## 2015-03-31 NOTE — Anesthesia Postprocedure Evaluation (Signed)
Anesthesia Post Note  Patient: Cyndia Sala Meggett  Procedure(s) Performed: Procedure(s) (LRB): DILATATION & CURETTAGE/HYSTEROSCOPY WITH NOVASURE ABLATION; NOVASURE UTERINE CAVITY LENGTH 4.7 CM, WIDTH 3.7 CM, POWER 96 WATTS, TIME 2 MINUTES (N/A)  Patient location during evaluation: PACU Anesthesia Type: General Level of consciousness: awake and patient cooperative Pain management: pain level controlled Vital Signs Assessment: post-procedure vital signs reviewed and stable Respiratory status: spontaneous breathing and patient connected to face mask oxygen Cardiovascular status: blood pressure returned to baseline Postop Assessment: no signs of nausea or vomiting Anesthetic complications: no    Last Vitals:  Filed Vitals:   03/31/15 0710 03/31/15 0715  BP: 116/66 116/66  Temp:    Resp: 23 13    Last Pain: There were no vitals filed for this visit.               Kala Gassmann J

## 2015-03-31 NOTE — H&P (View-Only) (Signed)
Patient ID: [name], female DOB: 06-06-1980, 34 y.o. MRN: [id number]   Preoperative History and Physical  [name] is a 34 y.o. G4P4 with h/o tubal ligation, menorrhagia and dysmenorrhea, here for surgical management of hysteroscopy D&C, endometrial ablation. No significant preoperative concerns. Pt states she previously had an IUD taken out due to increased pain. Pt reports heavy menses, lasting several days, s/p tubal ligation. She notes that she just finished her period. She denies any new symptoms.   Proposed surgery: hysteroscopy D&C, endometrial ablation  Past Medical History  Diagnosis Date  . Gestational diabetes   . Breast nodule 11/27/2013    Has firm mobile nodule at 11 0' clock 2 fb from areola and a tender nodule at 1 o'clock 5 fb in right breast  . Obesity 09/25/2014  . Menorrhagia with regular cycle 03/16/2015  . Dysmenorrhea 03/16/2015   Past Surgical History  Procedure Laterality Date  . Tubal ligation    . Cholecystectomy    . I&d left breast      mastitis   OB History  Gravida Para Term Preterm AB SAB TAB Ectopic Multiple Living  4 4        4     # Outcome Date GA Lbr Len/2nd Weight Sex Delivery Anes PTL Lv  4 Para           3 Para           2 Para           1 Para             Patient denies any other pertinent gynecologic issues.   Current Outpatient Prescriptions on File Prior to Visit  Medication Sig Dispense Refill  . phentermine 37.5 MG capsule Take 1 capsule (37.5 mg total) by mouth every morning. 30 capsule 0   No current facility-administered medications on file prior to visit.   Allergies  Allergen Reactions  . Keflex [Cephalexin]     Social History:  reports that she has never smoked. She has never used smokeless tobacco. She reports that she does not drink alcohol or use illicit  drugs.  Family History  Problem Relation Age of Onset  . Diabetes Mother   . Hypertension Mother   . Cancer Maternal Grandmother     kidney  . Coronary artery disease Maternal Grandmother   . Cancer Maternal Grandfather     lung,brain  . Coronary artery disease Maternal Grandfather   . Diabetes Paternal Grandmother   . Other Paternal Grandfather     myasthenia gravis  . Arthritis Brother   . Other Brother     crohns disease    Review of Systems: Noncontributory  PHYSICAL EXAM: Blood pressure 110/60, pulse 74, height 5\' 3"  (1.6 m), weight 149 lb (67.586 kg), last menstrual period 03/19/2015. General appearance - alert, well appearing, and in no distress Chest - clear to auscultation, no wheezes, rales or rhonchi, symmetric air entry Heart - normal rate and regular rhythm Abdomen - soft, nontender, nondistended, no masses or organomegaly Pelvic - normal external genitalia, vulva, vagina, cervix, uterus and adnexa Vulva- normal appearing vulva with no masses, tenderness or lesions VAGINA: normal appearing vagina with normal color and discharge, no lesions CERVIX: normal appearing cervix without discharge or lesions UTERUS: uterus is normal size, shape, consistency and nontender; anteverted  ADNEXA: normal adnexa in size, nontender and no masses.  Extremities - peripheral pulses normal, no pedal edema, no clubbing or cyanosis  Labs: No results found  for this or any previous visit (from the past 336 hour(s)).  Imaging Studies:  Imaging Results    No results found.    Assessment:  1. Menorrhagia s/p tubal ligation  2. Collected GC/CHL   Patient Active Problem List   Diagnosis Date Noted  . Menorrhagia with regular cycle 03/16/2015  . Dysmenorrhea 03/16/2015  . Obesity 09/25/2014  . Breast nodule 11/27/2013    Plan: Patient will undergo surgical management with hysteroscopy D&C,  endometrial ablation.    .mec 03/29/2015 9:45 AM   By signing my name below, I, Hansel Feinstein, attest that this documentation has been prepared under the direction and in the presence of [name], MD. Electronically Signed: Hansel Feinstein, ED Scribe. 03/29/2015. 9:36 AM.       I personally performed the services described in this documentation, which was SCRIBED in my presence. The recorded information has been reviewed and considered accurate. It has been edited as necessary during review. [name], MD

## 2015-03-31 NOTE — Anesthesia Procedure Notes (Signed)
Procedure Name: LMA Insertion Date/Time: 03/31/2015 7:46 AM Performed by: Charmaine Downs Pre-anesthesia Checklist: Emergency Drugs available, Patient identified, Suction available and Patient being monitored Patient Re-evaluated:Patient Re-evaluated prior to inductionOxygen Delivery Method: Circle system utilized Preoxygenation: Pre-oxygenation with 100% oxygen Intubation Type: IV induction Ventilation: Mask ventilation without difficulty LMA: LMA inserted LMA Size: 4.0 Grade View: Grade I Number of attempts: 1 Placement Confirmation: positive ETCO2 and breath sounds checked- equal and bilateral Tube secured with: Tape Dental Injury: Teeth and Oropharynx as per pre-operative assessment

## 2015-03-31 NOTE — Anesthesia Preprocedure Evaluation (Signed)
Anesthesia Evaluation  Patient identified by MRN, date of birth, ID band Patient awake    Reviewed: Allergy & Precautions, NPO status , Patient's Chart, lab work & pertinent test results  Airway Mallampati: I  TM Distance: >3 FB     Dental  (+) Teeth Intact   Pulmonary neg pulmonary ROS,    breath sounds clear to auscultation       Cardiovascular hypertension, Pt. on medications  Rhythm:Regular Rate:Normal     Neuro/Psych    GI/Hepatic negative GI ROS,   Endo/Other  Diabetes: gestational DM hx.  Renal/GU      Musculoskeletal   Abdominal   Peds  Hematology   Anesthesia Other Findings   Reproductive/Obstetrics                             Anesthesia Physical Anesthesia Plan  ASA: II  Anesthesia Plan:    Post-op Pain Management:    Induction: Intravenous  Airway Management Planned: LMA  Additional Equipment:   Intra-op Plan:   Post-operative Plan: Extubation in OR  Informed Consent: I have reviewed the patients History and Physical, chart, labs and discussed the procedure including the risks, benefits and alternatives for the proposed anesthesia with the patient or authorized representative who has indicated his/her understanding and acceptance.     Plan Discussed with:   Anesthesia Plan Comments:         Anesthesia Quick Evaluation

## 2015-03-31 NOTE — Transfer of Care (Signed)
Immediate Anesthesia Transfer of Care Note  Patient: [name]  Procedure(s) Performed: Procedure(s): DILATATION & CURETTAGE/HYSTEROSCOPY WITH NOVASURE ABLATION; NOVASURE UTERINE CAVITY LENGTH 4.7 CM, WIDTH 3.7 CM, POWER 96 WATTS, TIME 2 MINUTES (N/A)  Patient Location: PACU  Anesthesia Type:General  Level of Consciousness: awake and patient cooperative  Airway & Oxygen Therapy: Patient Spontanous Breathing and Patient connected to face mask oxygen  Post-op Assessment: Report given to RN, Post -op Vital signs reviewed and stable and Patient moving all extremities  Post vital signs: Reviewed and stable  Last Vitals:  Filed Vitals:   03/31/15 0710 03/31/15 0715  BP: 116/66 116/66  Temp:    Resp: 23 13    Complications: No apparent anesthesia complications

## 2015-03-31 NOTE — Op Note (Signed)
Please see the brief operative note for surgical details 

## 2015-03-31 NOTE — Addendum Note (Signed)
Addendum  created 03/31/15 0831 by Charmaine Downs, CRNA   Modules edited: Anesthesia Medication Administration

## 2015-04-01 ENCOUNTER — Encounter (HOSPITAL_COMMUNITY): Payer: Self-pay | Admitting: Obstetrics and Gynecology

## 2015-04-14 ENCOUNTER — Encounter: Payer: Self-pay | Admitting: Obstetrics and Gynecology

## 2015-04-14 ENCOUNTER — Ambulatory Visit (INDEPENDENT_AMBULATORY_CARE_PROVIDER_SITE_OTHER): Payer: BLUE CROSS/BLUE SHIELD | Admitting: Obstetrics and Gynecology

## 2015-04-14 ENCOUNTER — Ambulatory Visit: Payer: BLUE CROSS/BLUE SHIELD | Admitting: Adult Health

## 2015-04-14 VITALS — BP 118/70 | Ht 63.0 in | Wt 146.0 lb

## 2015-04-14 DIAGNOSIS — N920 Excessive and frequent menstruation with regular cycle: Secondary | ICD-10-CM

## 2015-04-14 DIAGNOSIS — Z98891 History of uterine scar from previous surgery: Secondary | ICD-10-CM

## 2015-04-14 DIAGNOSIS — Z713 Dietary counseling and surveillance: Secondary | ICD-10-CM | POA: Insufficient documentation

## 2015-04-14 DIAGNOSIS — Z09 Encounter for follow-up examination after completed treatment for conditions other than malignant neoplasm: Secondary | ICD-10-CM

## 2015-04-14 NOTE — Progress Notes (Signed)
Patient ID: [name], female   DOB: March 22, 1981, 35 y.o.   MRN: [id number] Pt her today for post op visit. Pt states that she does still have some discharge but not bad.

## 2015-04-14 NOTE — Progress Notes (Signed)
Patient ID: Creedence Raabe Rocha, female   DOB: Feb 22, 1981, 35 y.o.   MRN: [id number]    Subjective:  Jaynie Hawksworth Kram is a 35 y.o. female now 2 weeks status post operative hysteroscopy.    no complaints . Back at work Review of Systems Negative except    Diet:   reg   Bowel movements : normal.  The patient is not having any pain.  Objective:  BP 118/70 mmHg  Ht 5\' 3"  (1.6 m)  Wt 146 lb (66.225 kg)  BMI 25.87 kg/m2  LMP 03/19/2015 General:Well developed, well nourished.  No acute distress. Abdomen: Bowel sounds normal, soft, non-tender. Pelvic Exam:    External Genitalia:  Normal.    Vagina: Normal    Cervix: Normal    Uterus: Normal    Adnexa/Bimanual: Normal  Incision(s):    Speculum check , no pain d/c or bleeding uterus anterior normal ssc    Assessment:  Post-Op 2 weeks s/p operative hysteroscopy and with endometrial ablation   doing well  Doing well postoperatively.   Plan:  1.Wound care discussed   2. . Current medications.none 3. Activity restrictions: already at work 4. return to work: done. 5. Follow up in 3 years.

## 2015-07-14 ENCOUNTER — Ambulatory Visit (INDEPENDENT_AMBULATORY_CARE_PROVIDER_SITE_OTHER): Payer: BLUE CROSS/BLUE SHIELD | Admitting: Adult Health

## 2015-07-14 ENCOUNTER — Encounter: Payer: Self-pay | Admitting: Adult Health

## 2015-07-14 VITALS — BP 120/62 | HR 90 | Ht 63.0 in | Wt 158.0 lb

## 2015-07-14 DIAGNOSIS — Z6828 Body mass index (BMI) 28.0-28.9, adult: Secondary | ICD-10-CM

## 2015-07-14 DIAGNOSIS — Z713 Dietary counseling and surveillance: Secondary | ICD-10-CM

## 2015-07-14 HISTORY — DX: Body mass index (BMI) 28.0-28.9, adult: Z68.28

## 2015-07-14 MED ORDER — PHENTERMINE HCL 37.5 MG PO CAPS
37.5000 mg | ORAL_CAPSULE | ORAL | Status: DC
Start: 2015-07-14 — End: 2015-08-16

## 2015-07-14 NOTE — Patient Instructions (Signed)
Increase exercise Follow up in 4 weeks Increase water

## 2015-07-14 NOTE — Progress Notes (Signed)
Subjective:     Patient ID: Loyal Gambler Hogenson, female   DOB: 06/13/1980, 35 y.o.   MRN: [id number]  HPI Gurkirat is a 35 year old white female, married G4P4 in to try adipex again has gained some weight, since in nursing school.   Review of Systems Patient denies any headaches, hearing loss, fatigue, blurred vision, shortness of breath, chest pain, abdominal pain, problems with bowel movements, urination, or intercourse. No joint pain or mood swings.+ weight gain Reviewed past medical,surgical, social and family history. Reviewed medications and allergies.     Objective:   Physical Exam BP 120/62 mmHg  Pulse 90  Ht 5\' 3"  (1.6 m)  Wt 158 lb (71.668 kg)  BMI 28.00 kg/m2   Skin warm and dry.  Lungs: clear to ausculation bilaterally. Cardiovascular: regular rate and rhythm. Discussed to in crease water ,and snack better and exercise Assessment:     Weight loss counseling BMI 28    Plan:     Rx adipex 37.5 mg #30 take 1 daily  Return in 4 weeks for weight and BP check Increase water and exercise

## 2015-08-13 ENCOUNTER — Ambulatory Visit: Payer: BLUE CROSS/BLUE SHIELD | Admitting: Adult Health

## 2015-08-16 ENCOUNTER — Encounter: Payer: Self-pay | Admitting: Adult Health

## 2015-08-16 ENCOUNTER — Ambulatory Visit (INDEPENDENT_AMBULATORY_CARE_PROVIDER_SITE_OTHER): Payer: BLUE CROSS/BLUE SHIELD | Admitting: Adult Health

## 2015-08-16 VITALS — BP 120/60 | HR 84 | Ht 63.0 in | Wt 149.0 lb

## 2015-08-16 DIAGNOSIS — Z6826 Body mass index (BMI) 26.0-26.9, adult: Secondary | ICD-10-CM

## 2015-08-16 DIAGNOSIS — Z713 Dietary counseling and surveillance: Secondary | ICD-10-CM

## 2015-08-16 MED ORDER — PHENTERMINE HCL 37.5 MG PO CAPS: 37.5000 mg | ORAL_CAPSULE | ORAL | Status: DC

## 2015-08-16 NOTE — Patient Instructions (Signed)
Follow up in 4 weeks Continue adipex and eating healthy

## 2015-08-16 NOTE — Progress Notes (Signed)
Subjective:     Patient ID: [name], female   DOB: 06/17/1980, 35 y.o.   MRN: [id number]  HPI [name] is a 35 year old white female in for a weight and BP check from starting adipex, no complaints, says she is doing good.   Review of Systems Patient denies any headaches, hearing loss, fatigue, blurred vision, shortness of breath, chest pain, abdominal pain, problems with bowel movements, urination, or intercourse. No joint pain or mood swings. Reviewed past medical,surgical, social and family history. Reviewed medications and allergies.     Objective:   Physical Exam BP 120/60 mmHg  Pulse 84  Ht 5\' 3"  (1.6 m)  Wt 149 lb (67.586 kg)  BMI 26.40 kg/m2   Skin warm and dry.  Lungs: clear to ausculation bilaterally. Cardiovascular: regular rate and rhythm. She has lost 9 lbs this months and increased exercise and water, which has made her feel better and has done well in school.  Assessment:       BMI 26.4 Weight loss counseling   Plan:      Rx adipex 37.5 mg #30 take 1 daily Follow up in 4 weeks Continue weight loss efforts

## 2015-09-16 ENCOUNTER — Ambulatory Visit: Payer: BLUE CROSS/BLUE SHIELD | Admitting: Adult Health

## 2015-12-21 ENCOUNTER — Ambulatory Visit (INDEPENDENT_AMBULATORY_CARE_PROVIDER_SITE_OTHER): Payer: BLUE CROSS/BLUE SHIELD | Admitting: Adult Health

## 2015-12-21 ENCOUNTER — Encounter: Payer: Self-pay | Admitting: Adult Health

## 2015-12-21 VITALS — BP 130/62 | HR 92 | Ht 63.0 in | Wt 164.5 lb

## 2015-12-21 DIAGNOSIS — Z713 Dietary counseling and surveillance: Secondary | ICD-10-CM

## 2015-12-21 DIAGNOSIS — Z6829 Body mass index (BMI) 29.0-29.9, adult: Secondary | ICD-10-CM | POA: Diagnosis not present

## 2015-12-21 DIAGNOSIS — E669 Obesity, unspecified: Secondary | ICD-10-CM | POA: Diagnosis not present

## 2015-12-21 DIAGNOSIS — F4541 Pain disorder exclusively related to psychological factors: Secondary | ICD-10-CM

## 2015-12-21 MED ORDER — PHENTERMINE HCL 37.5 MG PO CAPS: 37.5000 mg | ORAL_CAPSULE | Freq: Every day | ORAL | 0 refills | Status: DC

## 2015-12-21 NOTE — Progress Notes (Signed)
Subjective:     Patient ID: [name], female   DOB: 1980/07/11, 35 y.o.   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  Review of Systems +weight gain +headaches Reviewed past medical,surgical, social and family history. Reviewed medications and allergies.     Objective:   Physical Exam BP 130/62 (BP Location: Left Arm, Patient Position: Sitting, Cuff Size: Normal)   Pulse 92   Ht 5\' 3"  (1.6 m)   Wt 164 lb 8 oz (74.6 kg)   BMI 29.14 kg/m  Skin warm and dry.  Lungs: clear to ausculation bilaterally. Cardiovascular: regular rate and rhythm.    Assessment:     1. Weight loss counseling, encounter for   2. Body mass index 29.0-29.9, adult   3. Stress headaches       Plan:     Rx phentermine 37.5 mg #30 take 1 daily Try massage, heat and ice to neck, get upfrom computer and rotate neck often, if tight and see eye doctor Follow up in 4 weeks Review handout on weight loss

## 2015-12-21 NOTE — Patient Instructions (Addendum)

## 2016-01-18 ENCOUNTER — Ambulatory Visit: Payer: BLUE CROSS/BLUE SHIELD | Admitting: Adult Health

## 2016-01-20 ENCOUNTER — Encounter: Payer: Self-pay | Admitting: Adult Health

## 2016-01-20 ENCOUNTER — Ambulatory Visit (INDEPENDENT_AMBULATORY_CARE_PROVIDER_SITE_OTHER): Payer: BLUE CROSS/BLUE SHIELD | Admitting: Adult Health

## 2016-01-20 VITALS — BP 110/60 | HR 82 | Ht 62.0 in | Wt 157.5 lb

## 2016-01-20 DIAGNOSIS — Z6828 Body mass index (BMI) 28.0-28.9, adult: Secondary | ICD-10-CM

## 2016-01-20 DIAGNOSIS — Z713 Dietary counseling and surveillance: Secondary | ICD-10-CM | POA: Diagnosis not present

## 2016-01-20 DIAGNOSIS — E669 Obesity, unspecified: Secondary | ICD-10-CM

## 2016-01-20 MED ORDER — PHENTERMINE HCL 37.5 MG PO CAPS: 37.5000 mg | ORAL_CAPSULE | Freq: Every day | ORAL | 0 refills | Status: DC

## 2016-01-20 NOTE — Patient Instructions (Signed)
Continue weight loss Follow up in 4 weeks 

## 2016-01-20 NOTE — Progress Notes (Signed)
Subjective:     Patient ID: [name], female   DOB: 02/04/1981, 35 y.o.   MRN: [id number]  HPI [name] is a 35 year old white female in for weight and BP check on adipex, no complaints.  Review of Systems Patient denies any headaches, hearing loss, fatigue, blurred vision, shortness of breath, chest pain, abdominal pain, problems with bowel movements, urination, or intercourse. No joint pain or mood swings. Reviewed past medical,surgical, social and family history. Reviewed medications and allergies.     Objective:   Physical Exam BP 110/60 (BP Location: Left Arm, Patient Position: Sitting, Cuff Size: Normal)   Pulse 82   Ht 5\' 2"  (1.575 m)   Wt 157 lb 8 oz (71.4 kg)   BMI 28.81 kg/m  Skin warm and dry.  Lungs: clear to ausculation bilaterally. Cardiovascular: regular rate and rhythm.   Lost 7 lbs, will continue adipex. PHQ 2 score 0. Assessment:     1. Weight loss counseling, encounter for   2. Body mass index 28.0-28.9, adult       Plan:     Meds ordered this encounter  Medications  . phentermine 37.5 MG capsule    Sig: Take 1 capsule (37.5 mg total) by mouth daily.    Dispense:  30 capsule    Refill:  0    Order Specific Question:   Supervising Provider    Answer:   Tania Ade H [[id number]]  Follow up in 4 weeks for physical and labs

## 2016-02-22 ENCOUNTER — Ambulatory Visit (INDEPENDENT_AMBULATORY_CARE_PROVIDER_SITE_OTHER): Payer: BLUE CROSS/BLUE SHIELD | Admitting: Adult Health

## 2016-02-22 ENCOUNTER — Encounter: Payer: Self-pay | Admitting: Adult Health

## 2016-02-22 VITALS — BP 120/66 | HR 70 | Ht 62.5 in | Wt 156.0 lb

## 2016-02-22 DIAGNOSIS — E669 Obesity, unspecified: Secondary | ICD-10-CM | POA: Diagnosis not present

## 2016-02-22 DIAGNOSIS — Z713 Dietary counseling and surveillance: Secondary | ICD-10-CM | POA: Diagnosis not present

## 2016-02-22 DIAGNOSIS — Z01419 Encounter for gynecological examination (general) (routine) without abnormal findings: Secondary | ICD-10-CM | POA: Diagnosis not present

## 2016-02-22 DIAGNOSIS — Z6828 Body mass index (BMI) 28.0-28.9, adult: Secondary | ICD-10-CM | POA: Diagnosis not present

## 2016-02-22 MED ORDER — PHENTERMINE HCL 37.5 MG PO CAPS: 37.5000 mg | ORAL_CAPSULE | Freq: Every day | ORAL | 0 refills | Status: DC

## 2016-02-22 NOTE — Progress Notes (Signed)
Patient ID: [name], female   DOB: 1981/03/22, 35 y.o.   MRN: [id number] History of Present Illness:  [name] is a 35 year old white female,married in for a well woman gyn exam,she had normal pap with negative H{V 10/26/14.She is taking phentermine and has lost about 9 lbs.No complaints.  Current Medications, Allergies, Past Medical History, Past Surgical History, Family History and Social History were reviewed in [employer] record.     Review of Systems: Patient denies any headaches, hearing loss, fatigue, blurred vision, shortness of breath, chest pain, abdominal pain, problems with bowel movements, urination, or intercourse. No joint pain or mood swings.    Physical Exam:BP 120/66 (BP Location: Left Arm, Patient Position: Sitting, Cuff Size: Normal)   Pulse 70   Ht 5' 2.5" (1.588 m)   Wt 156 lb (70.8 kg)   BMI 28.08 kg/m  General:  Well developed, well nourished, no acute distress Skin:  Warm and dry Neck:  Midline trachea, normal thyroid, good ROM, no lymphadenopathy Lungs; Clear to auscultation bilaterally Breast:  No dominant palpable mass, retraction, or nipple discharge Cardiovascular: Regular rate and rhythm Abdomen:  Soft, non tender, no hepatosplenomegaly Pelvic:  External genitalia is normal in appearance, no lesions.  The vagina is normal in appearance, has some pink spotting(first since ablation in 12/16). Urethra has no lesions or masses. The cervix is bulbous.  Uterus is felt to be normal size, shape, and contour.  No adnexal masses or tenderness noted.Bladder is non tender, no masses felt. Extremities/musculoskeletal:  No swelling or varicosities noted, no clubbing or cyanosis Psych:  No mood changes, alert and cooperative,seems happy PHQ 2 score 0  Impression:  1. Encounter for gynecological examination without abnormal finding   2. Weight loss counseling, encounter for   3. Body mass index 28.0-28.9, adult      Plan: Refilled  phentermine 37.5 mg #30 take 1 daily Physical in  1 year Pap in 2019  Follow up in 4 weeks  Continue weight loss efforts

## 2016-02-22 NOTE — Patient Instructions (Signed)
Physical in  1 year Pap in 2019  Follow up in 4 weeks  Continue weight loss

## 2016-03-21 ENCOUNTER — Ambulatory Visit: Payer: BLUE CROSS/BLUE SHIELD | Admitting: Adult Health

## 2016-04-28 IMAGING — US US BREAST LTD UNI RIGHT INC AXILLA
1 series · 1 of 1 positions shown · non-contrast
Comparison: None.

CLINICAL DATA: 32-year-old female with palpable thickening in the
outer and upper right breast discovered on self and clinical
examination.

EXAM:
DIGITAL DIAGNOSTIC  BILATERAL MAMMOGRAM WITH CAD
ULTRASOUND RIGHT BREAST

[Series 1: us breast ltd uni right inc axilla · 0.06mm/px · 1 of 1 slices shown]
[im 1/1]
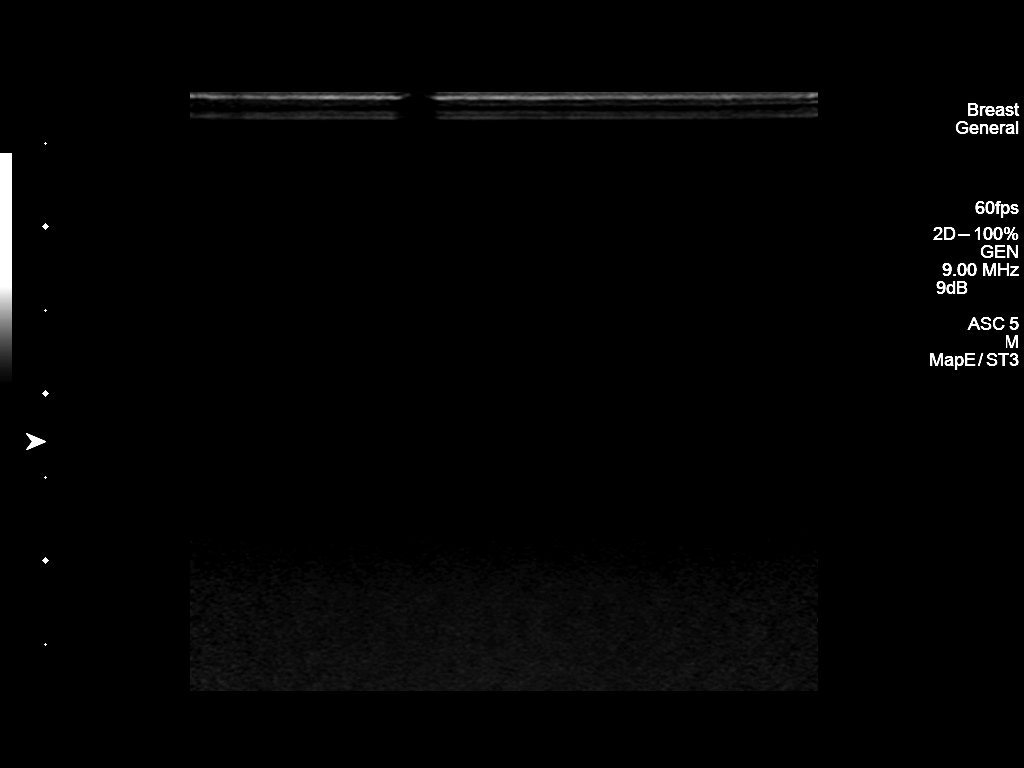

[1 of 1 positions shown; findings below may reference images not displayed]

ACR Breast Density Category b: There are scattered areas of
fibroglandular density.
FINDINGS: Spot compression views of the right breast and routine views of both
breasts demonstrate no suspicious mass, distortion or worrisome
calcifications.

Mammographic images were processed with CAD.

On physical exam, mild thickening is identified within the upper
outer and upper right breast.

Ultrasound is performed, showing no evidence of solid or cystic
mass, distortion or abnormal areas of shadowing within the outer or
upper right breast.
IMPRESSION: No suspicious mammographic, palpable or sonographic abnormality in
the right breast in the areas of palpable concern.

No mammographic evidence of breast malignancy bilaterally.

RECOMMENDATION:
Bilateral screening mammograms at age 42.

I have discussed the findings and recommendations with the patient.
Results were also provided in writing at the conclusion of the
visit. If applicable, a reminder letter will be sent to the patient
regarding the next appointment.

BI-RADS CATEGORY  1: Negative.

## 2016-11-16 ENCOUNTER — Ambulatory Visit: Payer: BLUE CROSS/BLUE SHIELD | Admitting: Adult Health

## 2017-05-09 ENCOUNTER — Ambulatory Visit: Payer: 59 | Admitting: Adult Health

## 2017-05-09 ENCOUNTER — Encounter: Payer: Self-pay | Admitting: Adult Health

## 2017-05-09 VITALS — BP 120/80 | HR 96 | Ht 63.0 in | Wt 179.0 lb

## 2017-05-09 DIAGNOSIS — Z713 Dietary counseling and surveillance: Secondary | ICD-10-CM | POA: Diagnosis not present

## 2017-05-09 DIAGNOSIS — Z6831 Body mass index (BMI) 31.0-31.9, adult: Secondary | ICD-10-CM | POA: Diagnosis not present

## 2017-05-09 DIAGNOSIS — Z6830 Body mass index (BMI) 30.0-30.9, adult: Secondary | ICD-10-CM | POA: Insufficient documentation

## 2017-05-09 MED ORDER — PHENTERMINE HCL 37.5 MG PO TABS: 37.5000 mg | ORAL_TABLET | Freq: Every day | ORAL | 0 refills | Status: DC

## 2017-05-09 NOTE — Progress Notes (Signed)
Subjective:     Patient ID: [name], female   DOB: 01/06/1981, 37 y.o.   MRN: [id number]  HPI [name] is a 37 year old white female, married, G4P4, sp ablation in to get back on adipex.   Review of Systems  Patient denies any headaches, hearing loss, fatigue, blurred vision, shortness of breath, chest pain, abdominal pain, problems with bowel movements, urination, or intercourse. No joint pain or mood swings. Has had family stress and gained some weight back, has even tried keto,Dad had MI with 6 stints and mom having surgery soon for hernia(lives 3 hours away) and brother had bad wreck but doing OK.She says work is good, but had to take leave from school.  Reviewed past medical,surgical, social and family history. Reviewed medications and allergies.     Objective:   Physical Exam BP 120/80 (BP Location: Left Arm, Patient Position: Sitting, Cuff Size: Small)   Pulse 96   Ht 5\' 3"  (1.6 m)   Wt 179 lb (81.2 kg)   BMI 31.71 kg/m    Skin warm and dry.  Lungs: clear to ausculation bilaterally. Cardiovascular: regular rate and rhythm. Do low carbs and exercise.   Assessment:     Weight loss counseling, encounter for  Body mass index 31.0-31.9, adult      Plan:     Meds ordered this encounter  Medications  . phentermine (ADIPEX-P) 37.5 MG tablet    Sig: Take 1 tablet (37.5 mg total) by mouth daily before breakfast.    Dispense:  30 tablet    Refill:  0    Order Specific Question:   Supervising Provider    Answer:   Tania Ade H [[id number]]  Return in 4 weeks for pap and physical

## 2017-06-11 ENCOUNTER — Other Ambulatory Visit (HOSPITAL_COMMUNITY)
Admission: RE | Admit: 2017-06-11 | Discharge: 2017-06-11 | Disposition: A | Discharge status: Home / Self Care | Payer: 59 | Source: Ambulatory Visit | Attending: Adult Health | Admitting: Adult Health

## 2017-06-11 ENCOUNTER — Encounter: Payer: Self-pay | Admitting: Adult Health

## 2017-06-11 ENCOUNTER — Ambulatory Visit (INDEPENDENT_AMBULATORY_CARE_PROVIDER_SITE_OTHER): Payer: 59 | Admitting: Adult Health

## 2017-06-11 VITALS — BP 130/90 | HR 83 | Ht 62.5 in | Wt 169.0 lb

## 2017-06-11 DIAGNOSIS — Z6830 Body mass index (BMI) 30.0-30.9, adult: Secondary | ICD-10-CM | POA: Diagnosis not present

## 2017-06-11 DIAGNOSIS — Z01411 Encounter for gynecological examination (general) (routine) with abnormal findings: Secondary | ICD-10-CM

## 2017-06-11 DIAGNOSIS — Z01419 Encounter for gynecological examination (general) (routine) without abnormal findings: Secondary | ICD-10-CM

## 2017-06-11 DIAGNOSIS — Z713 Dietary counseling and surveillance: Secondary | ICD-10-CM | POA: Diagnosis not present

## 2017-06-11 DIAGNOSIS — L723 Sebaceous cyst: Secondary | ICD-10-CM | POA: Insufficient documentation

## 2017-06-11 MED ORDER — PHENTERMINE HCL 37.5 MG PO TABS: 37.5000 mg | ORAL_TABLET | Freq: Every day | ORAL | 0 refills | Status: DC

## 2017-06-11 NOTE — Progress Notes (Signed)
Patient ID: [name], female   DOB: 05-08-1980, 37 y.o.   MRN: [id number] History of Present Illness:  [name] is a 37 year old white female, married, G4P4 in for a well woman gyn exam and pap, and for weight check.  Current Medications, Allergies, Past Medical History, Past Surgical History, Family History and Social History were reviewed in [employer] record.     Review of Systems:  Patient denies any headaches, hearing loss, fatigue, blurred vision, shortness of breath, chest pain, abdominal pain, problems with bowel movements, urination, or intercourse. No joint pain or mood swings. Has lump on back that hurts when laying flat  Physical Exam:BP 130/90 (BP Location: Left Arm, Patient Position: Sitting, Cuff Size: Normal)   Pulse 83   Ht 5' 2.5" (1.588 m)   Wt 169 lb (76.7 kg)   BMI 30.42 kg/m She has lost 10 lbs.  General:  Well developed, well nourished, no acute distress Skin:  Warm and dry,has 3 cm sebaceous cyst on back Neck:  Midline trachea, normal thyroid, good ROM, no lymphadenopathy Lungs; Clear to auscultation bilaterally Breast:  No dominant palpable mass, retraction, or nipple discharge Cardiovascular: Regular rate and rhythm Abdomen:  Soft, non tender, no hepatosplenomegaly Pelvic:  External genitalia is normal in appearance, no lesions.  The vagina is normal in appearance. Urethra has no lesions or masses. The cervix is bulbous.+spotting, Pap with HPV performed. Uterus is felt to be normal size, shape, and contour.  No adnexal masses or tenderness noted.Bladder is non tender, no masses felt. Extremities/musculoskeletal:  No swelling or varicosities noted, no clubbing or cyanosis Psych:  No mood changes, alert and cooperative,seems happy PHQ 2 score 0.  Impression: 1. Encounter for gynecological examination with Papanicolaou smear of cervix   2. Weight loss counseling, encounter for   3. Body mass index 30.0-30.9, adult   4. Sebaceous cyst        Plan: Physical in 1 year Pap in 3 if normal Meds ordered this encounter  Medications  . phentermine (ADIPEX-P) 37.5 MG tablet    Sig: Take 1 tablet (37.5 mg total) by mouth daily before breakfast.    Dispense:  30 tablet    Refill:  0    Order Specific Question:   Supervising Provider    Answer:   Tania Ade H [[id number]]  F/U in 4 weeks Referred to Dr Constance Haw for sebaceous cyst excision

## 2017-06-12 LAB — CYTOLOGY - PAP
Diagnosis: NEGATIVE
HPV (WINDOPATH): NOT DETECTED

## 2017-07-09 ENCOUNTER — Ambulatory Visit: Payer: 59 | Admitting: Adult Health

## 2017-07-09 ENCOUNTER — Encounter: Payer: Self-pay | Admitting: Adult Health

## 2017-07-09 ENCOUNTER — Other Ambulatory Visit: Payer: Self-pay

## 2017-07-09 VITALS — BP 124/72 | HR 97 | Ht 62.0 in | Wt 168.0 lb

## 2017-07-09 DIAGNOSIS — Z6830 Body mass index (BMI) 30.0-30.9, adult: Secondary | ICD-10-CM | POA: Diagnosis not present

## 2017-07-09 DIAGNOSIS — Z713 Dietary counseling and surveillance: Secondary | ICD-10-CM | POA: Diagnosis not present

## 2017-07-09 MED ORDER — PHENTERMINE HCL 37.5 MG PO TABS: 37.5000 mg | ORAL_TABLET | Freq: Every day | ORAL | 0 refills | Status: DC

## 2017-07-09 NOTE — Progress Notes (Signed)
Subjective:     Patient ID: [name], female   DOB: Aug 26, 1980, 37 y.o.   MRN: [id number]  HPI [name] is a 37 year old white female, in for weight and BP check. She has appt this month to see Dr Constance Haw for sebaceous cyst excision.   Review of Systems  Patient denies any headaches, hearing loss, fatigue, blurred vision, shortness of breath, chest pain, abdominal pain, problems with bowel movements, urination, or intercourse. No joint pain or mood swings. Reviewed past medical,surgical, social and family history. Reviewed medications and allergies.     Objective:   Physical Exam BP 124/72 (BP Location: Right Arm, Patient Position: Sitting, Cuff Size: Normal)   Pulse 97   Ht 5\' 2"  (1.575 m)   Wt 168 lb (76.2 kg)   BMI 30.73 kg/m  Skin warm and dry. Lungs: clear to ausculation bilaterally. Cardiovascular: regular rate and rhythm.Only lost a pound but has lost total of 11 lbs,will take 1 more month.    Assessment:     1. Weight loss counseling, encounter for   2. Body mass index 30.0-30.9, adult       Plan:     Meds ordered this encounter  Medications  . phentermine (ADIPEX-P) 37.5 MG tablet    Sig: Take 1 tablet (37.5 mg total) by mouth daily before breakfast.    Dispense:  30 tablet    Refill:  0    Order Specific Question:   Supervising Provider    Answer:   Tania Ade H [[id number]]  F/U prn

## 2017-07-24 ENCOUNTER — Ambulatory Visit: Payer: 59 | Admitting: General Surgery

## 2017-07-24 ENCOUNTER — Encounter: Payer: Self-pay | Admitting: General Surgery

## 2017-07-24 VITALS — BP 132/81 | HR 74 | Temp 98.2°F | Resp 18 | Ht 62.0 in | Wt 171.0 lb

## 2017-07-24 DIAGNOSIS — D171 Benign lipomatous neoplasm of skin and subcutaneous tissue of trunk: Secondary | ICD-10-CM

## 2017-07-24 NOTE — Patient Instructions (Signed)
Lipoma A lipoma is a noncancerous (benign) tumor that is made up of fat cells. This is a very common type of soft-tissue growth. Lipomas are usually found under the skin (subcutaneous). They may occur in any tissue of the body that contains fat. Common areas for lipomas to appear include the back, shoulders, buttocks, and thighs. Lipomas grow slowly, and they are usually painless. Most lipomas do not cause problems and do not require treatment. What are the causes? The cause of this condition is not known. What increases the risk? This condition is more likely to develop in:  People who are 40-60 years old.  People who have a family history of lipomas.  What are the signs or symptoms? A lipoma usually appears as a small, round bump under the skin. It may feel soft or rubbery, but the firmness can vary. Most lipomas are not painful. However, a lipoma may become painful if it is located in an area where it pushes on nerves. How is this diagnosed? A lipoma can usually be diagnosed with a physical exam. You may also have tests to confirm the diagnosis and to rule out other conditions. Tests may include:  Imaging tests, such as a CT scan or MRI.  Removal of a tissue sample to be looked at under a microscope (biopsy).  How is this treated? Treatment is not needed for small lipomas that are not causing problems. If a lipoma continues to get bigger or it causes problems, removal is often the best option. Lipomas can also be removed to improve appearance. Removal of a lipoma is usually done with a surgery in which the fatty cells and the surrounding capsule are removed. Most often, a medicine that numbs the area (local anesthetic) is used for this procedure. Follow these instructions at home:  Keep all follow-up visits as directed by your health care provider. This is important. Contact a health care provider if:  Your lipoma becomes larger or hard.  Your lipoma becomes painful, red, or  increasingly swollen. These could be signs of infection or a more serious condition. This information is not intended to replace advice given to you by your health care provider. Make sure you discuss any questions you have with your health care provider. Document Released: 03/17/2002 Document Revised: 09/02/2015 Document Reviewed: 03/23/2014 Elsevier Interactive Patient Education  2018 Elsevier Inc[employer]  

## 2017-07-24 NOTE — H&P (Signed)
[facility] History and Physical  Reason for Referral:Cyst/ Lipoma on Back  Referring Physician:  Derrek Monaco NP   Chief Complaint    Cyst      [name] is a 37 y.o. female.  HPI: [name] is a 37 yo otherwise healthy patient who comes in with complaints of a right back mass/ cyst that has been getting large in the last few months. For years it was about the size of a pea, and now it has started to enlarge and is becoming uncomfortable. It only has redness if she rubs on it, and it has never drained.  She is self conscious of the mass and feels that it can be seen through her shirt now that it is larger.   Past Medical History:  Diagnosis Date  . Body mass index 28.0-28.9, adult 07/14/2015  . Breast nodule 11/27/2013   Has firm mobile nodule at 11 0' clock 2 fb from areola and a tender nodule at 1 o'clock  5 fb in right breast  . Dysmenorrhea 03/16/2015  . Gestational diabetes   . Menorrhagia with regular cycle 03/16/2015  . Obesity 09/25/2014    Past Surgical History:  Procedure Laterality Date  . CHOLECYSTECTOMY    . DILITATION & CURRETTAGE/HYSTROSCOPY WITH NOVASURE ABLATION N/A 03/31/2015   Procedure: DILATATION & CURETTAGE/HYSTEROSCOPY WITH NOVASURE ABLATION; NOVASURE UTERINE CAVITY LENGTH 4.7 CM, WIDTH 3.7 CM, POWER 96 WATTS, TIME 2 MINUTES;  Surgeon: [name], MD;  Location: [facility];  Service: Gynecology;  Laterality: N/A;  . I&D left breast     mastitis  . TUBAL LIGATION      Family History  Problem Relation Age of Onset  . Diabetes Mother   . Hypertension Mother   . Cancer Maternal Grandmother        kidney  . Coronary artery disease Maternal Grandmother   . Cancer Maternal Grandfather        lung,brain  . Coronary artery disease Maternal Grandfather   . Diabetes Paternal Grandmother   . Other Paternal Grandfather        myasthenia gravis  . Arthritis Brother   . Other Brother        crohns disease  . Heart attack Father   .  Coronary artery disease Father     Social History   Tobacco Use  . Smoking status: Never Smoker  . Smokeless tobacco: Never Used  Substance Use Topics  . Alcohol use: No  . Drug use: No    Medications: I have reviewed the patient's current medications. Allergies as of 07/24/2017      Reactions   Keflex [cephalexin]       Medication List        Accurate as of 07/24/17  9:34 AM. Always use your most recent med list.          halobetasol 0.05 % ointment Commonly known as:  ULTRAVATE Apply topically 2 (two) times daily.   phentermine 37.5 MG tablet Commonly known as:  ADIPEX-P Take 1 tablet (37.5 mg total) by mouth daily before breakfast.        ROS:  A comprehensive review of systems was negative except for: Musculoskeletal: positive for mass on right back eczema  Blood pressure 132/81, pulse 74, temperature 98.2 F (36.8 C), resp. rate 18, height 5\' 2"  (1.575 m), weight 171 lb (77.6 kg). Physical Exam  Constitutional: She is oriented to person, place, and time. She appears well-developed and well-nourished.  HENT:  Head: Normocephalic.  Eyes: Pupils are equal, round, and reactive to light. EOM are normal.  Neck: Normal range of motion. Neck supple.  Cardiovascular: Normal rate and regular rhythm.  Pulmonary/Chest: Effort normal and breath sounds normal.  Abdominal: Soft. She exhibits no distension. There is no tenderness.  Musculoskeletal:  Right upper back, 3cm mobile mass, feels superficial, no fluctuance, no central pit or erythema   Neurological: She is alert and oriented to person, place, and time.  Skin: Skin is warm and dry.  Psychiatric: She has a normal mood and affect. Her behavior is normal. Judgment and thought content normal.  Vitals reviewed.   Results: None   Assessment & Plan:  [name] is a 37 y.o. female with what is likely a lipoma on her right upper back. She has had this for years but it has started to grow and cause her  discomfort. She wants this removed.    -OR for excision of the lipoma in the upcoming weeks    All questions were answered to the satisfaction of the patient and family.  The risk and benefits of excision of the lipoma were discussed including but not limited to bleeding, infection, risk of recurrence, risk of being a cyst, risk of being cancer.  After careful consideration, [name] has decided to proceed.    Virl Cagey 07/24/2017, 9:34 AM

## 2017-07-24 NOTE — Progress Notes (Signed)
[facility] History and Physical  Reason for Referral:Cyst/ Lipoma on Back  Referring Physician:  Derrek Monaco NP   Chief Complaint    Cyst      [name] is a 37 y.o. female.  HPI: [name] is a 37 yo otherwise healthy patient who comes in with complaints of a right back mass/ cyst that has been getting large in the last few months. For years it was about the size of a pea, and now it has started to enlarge and is becoming uncomfortable. It only has redness if she rubs on it, and it has never drained.  She is self conscious of the mass and feels that it can be seen through her shirt now that it is larger.   Past Medical History:  Diagnosis Date  . Body mass index 28.0-28.9, adult 07/14/2015  . Breast nodule 11/27/2013   Has firm mobile nodule at 11 0' clock 2 fb from areola and a tender nodule at 1 o'clock  5 fb in right breast  . Dysmenorrhea 03/16/2015  . Gestational diabetes   . Menorrhagia with regular cycle 03/16/2015  . Obesity 09/25/2014    Past Surgical History:  Procedure Laterality Date  . CHOLECYSTECTOMY    . DILITATION & CURRETTAGE/HYSTROSCOPY WITH NOVASURE ABLATION N/A 03/31/2015   Procedure: DILATATION & CURETTAGE/HYSTEROSCOPY WITH NOVASURE ABLATION; NOVASURE UTERINE CAVITY LENGTH 4.7 CM, WIDTH 3.7 CM, POWER 96 WATTS, TIME 2 MINUTES;  Surgeon: [name], MD;  Location: [facility];  Service: Gynecology;  Laterality: N/A;  . I&D left breast     mastitis  . TUBAL LIGATION      Family History  Problem Relation Age of Onset  . Diabetes Mother   . Hypertension Mother   . Cancer Maternal Grandmother        kidney  . Coronary artery disease Maternal Grandmother   . Cancer Maternal Grandfather        lung,brain  . Coronary artery disease Maternal Grandfather   . Diabetes Paternal Grandmother   . Other Paternal Grandfather        myasthenia gravis  . Arthritis Brother   . Other Brother        crohns disease  . Heart attack Father   .  Coronary artery disease Father     Social History   Tobacco Use  . Smoking status: Never Smoker  . Smokeless tobacco: Never Used  Substance Use Topics  . Alcohol use: No  . Drug use: No    Medications: I have reviewed the patient's current medications. Allergies as of 07/24/2017      Reactions   Keflex [cephalexin]       Medication List        Accurate as of 07/24/17  9:34 AM. Always use your most recent med list.          halobetasol 0.05 % ointment Commonly known as:  ULTRAVATE Apply topically 2 (two) times daily.   phentermine 37.5 MG tablet Commonly known as:  ADIPEX-P Take 1 tablet (37.5 mg total) by mouth daily before breakfast.        ROS:  A comprehensive review of systems was negative except for: Musculoskeletal: positive for mass on right back eczema  Blood pressure 132/81, pulse 74, temperature 98.2 F (36.8 C), resp. rate 18, height 5\' 2"  (1.575 m), weight 171 lb (77.6 kg). Physical Exam  Constitutional: She is oriented to person, place, and time. She appears well-developed and well-nourished.  HENT:  Head: Normocephalic.  Eyes: Pupils are equal, round, and reactive to light. EOM are normal.  Neck: Normal range of motion. Neck supple.  Cardiovascular: Normal rate and regular rhythm.  Pulmonary/Chest: Effort normal and breath sounds normal.  Abdominal: Soft. She exhibits no distension. There is no tenderness.  Musculoskeletal:  Right upper back, 3cm mobile mass, feels superficial, no fluctuance, no central pit or erythema   Neurological: She is alert and oriented to person, place, and time.  Skin: Skin is warm and dry.  Psychiatric: She has a normal mood and affect. Her behavior is normal. Judgment and thought content normal.  Vitals reviewed.   Results: None   Assessment & Plan:  [name] is a 37 y.o. female with what is likely a lipoma on her right upper back. She has had this for years but it has started to grow and cause her  discomfort. She wants this removed.    -OR for excision of the lipoma in the upcoming weeks    All questions were answered to the satisfaction of the patient and family.  The risk and benefits of excision of the lipoma were discussed including but not limited to bleeding, infection, risk of recurrence, risk of being a cyst, risk of being cancer.  After careful consideration, [name] has decided to proceed.    [name] 07/24/2017, 9:34 AM

## 2017-07-31 NOTE — Patient Instructions (Signed)
[name]  [birth year]     @PREFPERIOPPHARMACY @   Your procedure is scheduled on 08/03/2017.  Report to [name] at 6:15 A.M.  [place] this number if you have problems the morning of surgery:  [phone]   Remember:  Do not eat food or drink liquids after midnight.  Take these medicines the morning of surgery with A SIP OF WATER None *  STOP PHENTERAMINE    Do not wear jewelry, make-up or nail polish.  Do not wear lotions, powders, or perfumes, or deodorant.  Do not shave 48 hours prior to surgery.  Men may shave face and neck.  Do not bring valuables to the hospital.  [facility] is not responsible for any belongings or valuables.  Contacts, dentures or bridgework may not be worn into surgery.  Leave your suitcase in the car.  After surgery it may be brought to your room.  For patients admitted to the hospital, discharge time will be determined by your treatment team.  Patients discharged the day of surgery will not be allowed to drive home.    Please read over the following fact sheets that you were given. Surgical Site Infection Prevention and Anesthesia Post-op Instructions     PATIENT INSTRUCTIONS POST-ANESTHESIA  IMMEDIATELY FOLLOWING SURGERY:  Do not drive or operate machinery for the first twenty four hours after surgery.  Do not make any important decisions for twenty four hours after surgery or while taking narcotic pain medications or sedatives.  If you develop intractable nausea and vomiting or a severe headache please notify your doctor immediately.  FOLLOW-UP:  Please make an appointment with your surgeon as instructed. You do not need to follow up with anesthesia unless specifically instructed to do so.  WOUND CARE INSTRUCTIONS (if applicable):  Keep a dry clean dressing on the anesthesia/puncture wound site if there is drainage.  Once the wound has quit draining you may leave it open to air.  Generally you should leave the bandage intact for twenty four  hours unless there is drainage.  If the epidural site drains for more than 36-48 hours please [place] the anesthesia department.  QUESTIONS?:  Please feel free to [place] your physician or the hospital operator if you have any questions, and they will be happy to assist you.      Lipoma Removal Lipoma removal is a surgical procedure to remove a noncancerous (benign) tumor that is made up of fat cells (lipoma). Most lipomas are small and painless and do not require treatment. They can form in many areas of the body but are most common under the skin of the back, shoulders, arms, and thighs. You may need lipoma removal if you have a lipoma that is large, growing, or causing discomfort. Lipoma removal may also be done for cosmetic reasons. Tell a health care provider about:  Any allergies you have.  All medicines you are taking, including vitamins, herbs, eye drops, creams, and over-the-counter medicines.  Any problems you or family members have had with anesthetic medicines.  Any blood disorders you have.  Any surgeries you have had.  Any medical conditions you have.  Whether you are pregnant or may be pregnant. What are the risks? Generally, this is a safe procedure. However, problems may occur, including:  Infection.  Bleeding.  Allergic reactions to medicines.  Damage to nerves or blood vessels near the lipoma.  Scarring.  What happens before the procedure? Staying hydrated Follow instructions from your health care provider about hydration,  which may include:  Up to 2 hours before the procedure - you may continue to drink clear liquids, such as water, clear fruit juice, black coffee, and plain tea.  Eating and drinking restrictions Follow instructions from your health care provider about eating and drinking, which may include:  8 hours before the procedure - stop eating heavy meals or foods such as meat, fried foods, or fatty foods.  6 hours before the procedure - stop  eating light meals or foods, such as toast or cereal.  6 hours before the procedure - stop drinking milk or drinks that contain milk.  2 hours before the procedure - stop drinking clear liquids.  Medicines  Ask your health care provider about: ? Changing or stopping your regular medicines. This is especially important if you are taking diabetes medicines or blood thinners. ? Taking medicines such as aspirin and ibuprofen. These medicines can thin your blood. Do not take these medicines before your procedure if your health care provider instructs you not to.  You may be given antibiotic medicine to help prevent infection. General instructions  Ask your health care provider how your surgical site will be marked or identified.  You will have a physical exam. Your health care provider will check the size of the lipoma and whether it can be moved easily.  You may have imaging tests, such as: ? X-rays. ? CT scan. ? MRI.  Plan to have someone take you home from the hospital or clinic. What happens during the procedure?  To reduce your risk of infection: ? Your health care team will wash or sanitize their hands. ? Your skin will be washed with soap.  You will be given one or more of the following: ? A medicine to help you relax (sedative). ? A medicine to numb the area (local anesthetic). ? A medicine to make you fall asleep (general anesthetic). ? A medicine that is injected into an area of your body to numb everything below the injection site (regional anesthetic).  An incision will be made over the lipoma or very near the lipoma. The incision may be made in a natural skin line or crease.  Tissues, nerves, and blood vessels near the lipoma will be moved out of the way.  The lipoma and the capsule that surrounds it will be separated from the surrounding tissues.  The lipoma will be removed.  The incision may be closed with stitches (sutures).  A bandage (dressing) will be  placed over the incision. What happens after the procedure?  Do not drive for 24 hours if you received a sedative.  Your blood pressure, heart rate, breathing rate, and blood oxygen level will be monitored until the medicines you were given have worn off. This information is not intended to replace advice given to you by your health care provider. Make sure you discuss any questions you have with your health care provider. Document Released: 06/10/2015 Document Revised: 09/02/2015 Document Reviewed: 06/10/2015 Elsevier Interactive Patient Education  [employer].

## 2017-08-01 ENCOUNTER — Encounter (HOSPITAL_COMMUNITY): Payer: Self-pay

## 2017-08-01 ENCOUNTER — Encounter (HOSPITAL_COMMUNITY)
Admission: RE | Admit: 2017-08-01 | Discharge: 2017-08-01 | Disposition: A | Discharge status: Home / Self Care | Payer: 59 | Source: Ambulatory Visit | Attending: General Surgery | Admitting: General Surgery

## 2017-08-01 ENCOUNTER — Other Ambulatory Visit: Payer: Self-pay

## 2017-08-01 DIAGNOSIS — L723 Sebaceous cyst: Secondary | ICD-10-CM | POA: Diagnosis not present

## 2017-08-01 DIAGNOSIS — D171 Benign lipomatous neoplasm of skin and subcutaneous tissue of trunk: Secondary | ICD-10-CM | POA: Diagnosis not present

## 2017-08-01 LAB — CBC
HEMATOCRIT: 41.7 % (ref 36.0–46.0)
HEMOGLOBIN: 13.6 g/dL (ref 12.0–15.0)
MCH: 29.4 pg (ref 26.0–34.0)
MCHC: 32.6 g/dL (ref 30.0–36.0)
MCV: 90.1 fL (ref 78.0–100.0)
Platelets: 251 10*3/uL (ref 150–400)
RBC: 4.63 MIL/uL (ref 3.87–5.11)
RDW: 13.3 % (ref 11.5–15.5)
WBC: 8.4 10*3/uL (ref 4.0–10.5)

## 2017-08-01 LAB — BASIC METABOLIC PANEL
Anion gap: 9 (ref 5–15)
BUN: 15 mg/dL (ref 6–20)
CHLORIDE: 104 mmol/L (ref 101–111)
CO2: 24 mmol/L (ref 22–32)
CREATININE: 0.61 mg/dL (ref 0.44–1.00)
Calcium: 8.8 mg/dL — ABNORMAL LOW (ref 8.9–10.3)
GFR calc Af Amer: >60 mL/min (ref >60)
GFR calc non Af Amer: >60 mL/min (ref >60)
GLUCOSE: 151 mg/dL — AB (ref 65–99)
Potassium: 3.5 mmol/L (ref 3.5–5.1)
SODIUM: 137 mmol/L (ref 135–145)

## 2017-08-01 LAB — HCG, SERUM, QUALITATIVE: Preg, Serum: NEGATIVE

## 2017-08-01 NOTE — Pre-Procedure Instructions (Signed)
Patient in for PAT and while going over preop instructions, patient tells staff that "I am not taking my fingernail polish off'. Explained to her importance of this and reasoning behind this and she states, "I know that, but I'm  not taking it off. I didn't last time and I'm not this time." Abbie Sons, Associate Professor notified.

## 2017-08-03 ENCOUNTER — Encounter (HOSPITAL_COMMUNITY)
Admission: RE | Disposition: A | Discharge status: Home / Self Care | Payer: Self-pay | Source: Ambulatory Visit | Attending: General Surgery

## 2017-08-03 ENCOUNTER — Ambulatory Visit (HOSPITAL_COMMUNITY)
Admission: RE | Admit: 2017-08-03 | Discharge: 2017-08-03 | Disposition: A | Discharge status: Home / Self Care | Payer: 59 | Source: Ambulatory Visit | Attending: General Surgery | Admitting: General Surgery

## 2017-08-03 ENCOUNTER — Ambulatory Visit (HOSPITAL_COMMUNITY): Payer: 59 | Admitting: Anesthesiology

## 2017-08-03 ENCOUNTER — Encounter (HOSPITAL_COMMUNITY): Payer: Self-pay

## 2017-08-03 DIAGNOSIS — D171 Benign lipomatous neoplasm of skin and subcutaneous tissue of trunk: Secondary | ICD-10-CM

## 2017-08-03 DIAGNOSIS — L723 Sebaceous cyst: Secondary | ICD-10-CM | POA: Insufficient documentation

## 2017-08-03 HISTORY — PX: MASS EXCISION: SHX2000

## 2017-08-03 SURGERY — EXCISION MASS
Anesthesia: General | Site: Back

## 2017-08-03 MED ORDER — CHLORHEXIDINE GLUCONATE CLOTH 2 % EX PADS: 6.0000 | MEDICATED_PAD | Freq: Once | CUTANEOUS | Status: DC

## 2017-08-03 MED ORDER — HYDROMORPHONE HCL 1 MG/ML IJ SOLN
0.2500 mg | INTRAMUSCULAR | Status: DC | PRN
  Administered 2017-08-03: 0.5 mg via INTRAVENOUS
  Filled 2017-08-03: qty 0.5

## 2017-08-03 MED ORDER — VANCOMYCIN HCL IN DEXTROSE 1-5 GM/200ML-% IV SOLN
1000.0000 mg | INTRAVENOUS | Status: AC
  Administered 2017-08-03: 1000 mg via INTRAVENOUS
  Filled 2017-08-03: qty 200

## 2017-08-03 MED ORDER — LIDOCAINE HCL (PF) 1 % IJ SOLN
INTRAMUSCULAR | Status: AC
  Filled 2017-08-03: qty 5

## 2017-08-03 MED ORDER — PROPOFOL 10 MG/ML IV BOLUS
INTRAVENOUS | Status: DC | PRN
  Administered 2017-08-03: 120 mg via INTRAVENOUS
  Administered 2017-08-03: 30 mg via INTRAVENOUS

## 2017-08-03 MED ORDER — FENTANYL CITRATE (PF) 100 MCG/2ML IJ SOLN
INTRAMUSCULAR | Status: AC
  Filled 2017-08-03: qty 2

## 2017-08-03 MED ORDER — LACTATED RINGERS IV SOLN
INTRAVENOUS | Status: DC | PRN
  Administered 2017-08-03: 07:00:00 via INTRAVENOUS

## 2017-08-03 MED ORDER — OXYCODONE HCL 5 MG PO TABS: 5.0000 mg | ORAL_TABLET | ORAL | 0 refills | Status: AC | PRN

## 2017-08-03 MED ORDER — 0.9 % SODIUM CHLORIDE (POUR BTL) OPTIME
TOPICAL | Status: DC | PRN
  Administered 2017-08-03: 1000 mL

## 2017-08-03 MED ORDER — LIDOCAINE HCL 1 % IJ SOLN
INTRAMUSCULAR | Status: DC | PRN
  Administered 2017-08-03: 25 mg via INTRADERMAL

## 2017-08-03 MED ORDER — FENTANYL CITRATE (PF) 100 MCG/2ML IJ SOLN
INTRAMUSCULAR | Status: DC | PRN
  Administered 2017-08-03 (×2): 50 ug via INTRAVENOUS

## 2017-08-03 MED ORDER — ONDANSETRON HCL 4 MG/2ML IJ SOLN: 4.0000 mg | Freq: Once | INTRAMUSCULAR | Status: DC | PRN

## 2017-08-03 MED ORDER — MEPERIDINE HCL 50 MG/ML IJ SOLN: 6.2500 mg | INTRAMUSCULAR | Status: DC | PRN

## 2017-08-03 MED ORDER — PROPOFOL 10 MG/ML IV BOLUS
INTRAVENOUS | Status: AC
  Filled 2017-08-03: qty 40

## 2017-08-03 MED ORDER — LACTATED RINGERS IV SOLN
INTRAVENOUS | Status: DC
  Administered 2017-08-03: 07:00:00 via INTRAVENOUS

## 2017-08-03 MED ORDER — BUPIVACAINE HCL (PF) 0.5 % IJ SOLN
INTRAMUSCULAR | Status: DC | PRN
  Administered 2017-08-03: 10 mL

## 2017-08-03 MED ORDER — HYDROCODONE-ACETAMINOPHEN 7.5-325 MG PO TABS: 1.0000 | ORAL_TABLET | Freq: Once | ORAL | Status: DC | PRN

## 2017-08-03 MED ORDER — DOCUSATE SODIUM 100 MG PO CAPS: 100.0000 mg | ORAL_CAPSULE | Freq: Two times a day (BID) | ORAL | 2 refills | Status: AC

## 2017-08-03 MED ORDER — ONDANSETRON HCL 4 MG/2ML IJ SOLN
INTRAMUSCULAR | Status: DC | PRN
  Administered 2017-08-03: 4 mg via INTRAVENOUS

## 2017-08-03 MED ORDER — MIDAZOLAM HCL 5 MG/5ML IJ SOLN
INTRAMUSCULAR | Status: DC | PRN
  Administered 2017-08-03: 2 mg via INTRAVENOUS

## 2017-08-03 MED ORDER — BUPIVACAINE HCL (PF) 0.5 % IJ SOLN
INTRAMUSCULAR | Status: AC
  Filled 2017-08-03: qty 30

## 2017-08-03 MED ORDER — KETOROLAC TROMETHAMINE 30 MG/ML IJ SOLN
30.0000 mg | Freq: Once | INTRAMUSCULAR | Status: AC | PRN
  Administered 2017-08-03: 30 mg via INTRAVENOUS

## 2017-08-03 MED ORDER — MIDAZOLAM HCL 2 MG/2ML IJ SOLN
INTRAMUSCULAR | Status: AC
  Filled 2017-08-03: qty 2

## 2017-08-03 MED ORDER — KETOROLAC TROMETHAMINE 30 MG/ML IJ SOLN
INTRAMUSCULAR | Status: AC
  Filled 2017-08-03: qty 1

## 2017-08-03 SURGICAL SUPPLY — 34 items
ADH SKN CLS APL DERMABOND .7 (GAUZE/BANDAGES/DRESSINGS) ×1
BAG HAMPER (MISCELLANEOUS) ×3 IMPLANT
CHLORAPREP W/TINT 10.5 ML (MISCELLANEOUS) ×3 IMPLANT
CLOTH BEACON ORANGE TIMEOUT ST (SAFETY) ×3 IMPLANT
COVER LIGHT HANDLE STERIS (MISCELLANEOUS) ×6 IMPLANT
DECANTER SPIKE VIAL GLASS SM (MISCELLANEOUS) ×3 IMPLANT
DERMABOND ADVANCED (GAUZE/BANDAGES/DRESSINGS) ×2
DERMABOND ADVANCED .7 DNX12 (GAUZE/BANDAGES/DRESSINGS) IMPLANT
ELECT NDL TIP 2.8 STRL (NEEDLE) IMPLANT
ELECT NEEDLE TIP 2.8 STRL (NEEDLE) ×3 IMPLANT
ELECT REM PT RETURN 9FT ADLT (ELECTROSURGICAL) ×3
ELECTRODE REM PT RTRN 9FT ADLT (ELECTROSURGICAL) ×1 IMPLANT
GLOVE BIO SURGEON STRL SZ 6.5 (GLOVE) ×2 IMPLANT
GLOVE BIO SURGEON STRL SZ7 (GLOVE) ×2 IMPLANT
GLOVE BIO SURGEONS STRL SZ 6.5 (GLOVE) ×1
GLOVE BIOGEL PI IND STRL 6.5 (GLOVE) ×1 IMPLANT
GLOVE BIOGEL PI IND STRL 7.0 (GLOVE) ×1 IMPLANT
GLOVE BIOGEL PI INDICATOR 6.5 (GLOVE) ×2
GLOVE BIOGEL PI INDICATOR 7.0 (GLOVE) ×4
GOWN STRL REUS W/ TWL XL LVL3 (GOWN DISPOSABLE) ×1 IMPLANT
GOWN STRL REUS W/TWL LRG LVL3 (GOWN DISPOSABLE) ×3 IMPLANT
GOWN STRL REUS W/TWL XL LVL3 (GOWN DISPOSABLE) ×3
KIT TURNOVER KIT A (KITS) ×3 IMPLANT
MANIFOLD NEPTUNE II (INSTRUMENTS) ×3 IMPLANT
NDL HYPO 21X1.5 SAFETY (NEEDLE) IMPLANT
NEEDLE HYPO 21X1.5 SAFETY (NEEDLE) ×3 IMPLANT
NS IRRIG 1000ML POUR BTL (IV SOLUTION) ×3 IMPLANT
PACK MINOR (CUSTOM PROCEDURE TRAY) ×2 IMPLANT
PAD ARMBOARD 7.5X6 YLW CONV (MISCELLANEOUS) ×3 IMPLANT
SET BASIN LINEN APH (SET/KITS/TRAYS/PACK) ×3 IMPLANT
SUT MNCRL AB 4-0 PS2 18 (SUTURE) ×3 IMPLANT
SUT VIC AB 3-0 SH 27 (SUTURE) ×3
SUT VIC AB 3-0 SH 27X BRD (SUTURE) IMPLANT
SYR CONTROL 10ML LL (SYRINGE) ×3 IMPLANT

## 2017-08-03 NOTE — Transfer of Care (Signed)
Immediate Anesthesia Transfer of Care Note  Patient: [name]  Procedure(s) Performed: EXCISION CYST ON BACK (N/A Back)  Patient Location: PACU  Anesthesia Type:General  Level of Consciousness: awake and patient cooperative  Airway & Oxygen Therapy: Patient Spontanous Breathing and Patient connected to face mask oxygen  Post-op Assessment: Report given to RN, Post -op Vital signs reviewed and stable and Patient moving all extremities  Post vital signs: Reviewed and stable  Last Vitals:  Vitals Value Taken Time  BP    Temp    Pulse 30 08/03/2017  8:45 AM  Resp 16 08/03/2017  8:45 AM  SpO2 100 % 08/03/2017  8:45 AM  Vitals shown include unvalidated device data.  Last Pain:  Vitals:   08/03/17 0627  TempSrc: Oral  PainSc: 0-No pain         Complications: No apparent anesthesia complications

## 2017-08-03 NOTE — Anesthesia Preprocedure Evaluation (Signed)
Anesthesia Evaluation  Patient identified by MRN, date of birth, ID band Patient awake    Reviewed: Allergy & Precautions, H&P , NPO status , Patient's Chart, lab work & pertinent test results  Airway Mallampati: I  TM Distance: >3 FB Neck ROM: full    Dental no notable dental hx.    Pulmonary neg pulmonary ROS,    Pulmonary exam normal breath sounds clear to auscultation       Cardiovascular Exercise Tolerance: Good negative cardio ROS   Rhythm:regular Rate:Normal     Neuro/Psych negative neurological ROS  negative psych ROS   GI/Hepatic negative GI ROS, Neg liver ROS,   Endo/Other  negative endocrine ROS  Renal/GU negative Renal ROS  negative genitourinary   Musculoskeletal   Abdominal   Peds  Hematology negative hematology ROS (+)   Anesthesia Other Findings BMI:  31.3  Reproductive/Obstetrics negative OB ROS                             Anesthesia Physical Anesthesia Plan  ASA: II  Anesthesia Plan: General   Post-op Pain Management:    Induction:   PONV Risk Score and Plan:   Airway Management Planned:   Additional Equipment:   Intra-op Plan:   Post-operative Plan:   Informed Consent: I have reviewed the patients History and Physical, chart, labs and discussed the procedure including the risks, benefits and alternatives for the proposed anesthesia with the patient or authorized representative who has indicated his/her understanding and acceptance.   Dental Advisory Given  Plan Discussed with: CRNA  Anesthesia Plan Comments:         Anesthesia Quick Evaluation

## 2017-08-03 NOTE — Op Note (Signed)
[facility] Operative Note  08/03/17  Preoperative Diagnosis: Back lipoma    Postoperative Diagnosis:  Back Sebaceous Cyst    Procedure(s) Performed: Excision of cyst, measuring 3cm    Surgeon: Lanell Matar. Constance Haw, MD   Assistants: No qualified resident was available   Anesthesia: General endotracheal   Anesthesiologist: Nicanor Alcon, MD    Specimens: Cyst    Estimated Blood Loss: Minimal   Blood Replacement: None    Complications: None   Wound Class: Clean    Operative Indications: [name] is a healthy 37 yo with a history of an area on the back that had been enlarging but had not ever been infected, drained or become erythematous. Due to this we suspected a lipoma, and after a discussion of the risk and benefits, she opted to have it removed.   Findings: Large cyst with fluid    Procedure: The patient was taken to the operating room. General endotracheal anesthesia was induced. Intravenous antibiotics were  administered per protocol.  She was placed in left lateral decubitus position with all pressure points padded. The right back was prepared and draped in the usual sterile fashion.   A horizontal incision was made in the mid back over the mass, and was carried down through the dermal tissue to the subcutaneous tissue.  I reached what I thought to be the wall and a hemostat was used to attempt encircling the mass which initially we had thought would be a lipoma.  This resulted in rupture of the large cyst, and cystic fluid was evacuated and suctioned from the cavity.  The fluid was a tannish color but had no foul odor.  The wall of the cyst was grasped with an Alis, and the cyst was removed from the subcutaneous tissue.  The cyst came to the level of the epidermis just superior to my prior incision, and a small button hole of skin was made.  The remaining cyst wall was removed without issue, and the cyst was sent to pathology.  The cavity was irrigated and  cauterized for both hemostasis and to ensure that no wall was remaining in the cavity. On inspection, normal subcutaneous fat was visualized.  Final inspection revealed acceptable hemostasis.   The deep space was closed with a 3-0 Vicryl suture, the skin on the button hole was closed with a 4-0 Monocryl, and the skin of the incision was closed with a 4-0 Monocryl suture.  Dermabond was applied to both areas of skin.     All counts were correct at the end of the case. The patient was awakened from anesthesia and extubate without complication.  The patient went to the PACU in stable condition.  I spoke with her mother after the case and informed her of the finding of a cyst, and the risk of recurrence although unlikely given that we were able to get the cyst wall fairly intact.     Curlene Labrum, MD [facility] [address] [place],  [id number] [phone] (office)

## 2017-08-03 NOTE — Anesthesia Postprocedure Evaluation (Signed)
Anesthesia Post Note  Patient: [name]  Procedure(s) Performed: EXCISION CYST ON BACK (N/A Back)  Patient location during evaluation: PACU Anesthesia Type: General Level of consciousness: awake and patient cooperative Pain management: pain level controlled Vital Signs Assessment: post-procedure vital signs reviewed and stable Respiratory status: spontaneous breathing, nonlabored ventilation and respiratory function stable Cardiovascular status: blood pressure returned to baseline Postop Assessment: no apparent nausea or vomiting Anesthetic complications: no     Last Vitals:  Vitals:   08/03/17 0715 08/03/17 0845  BP: 111/65   Pulse:  (!) 101  Resp: 14   Temp:  37 C  SpO2: 97%     Last Pain:  Vitals:   08/03/17 0845  TempSrc:   PainSc: (P) 7                  [place] J

## 2017-08-03 NOTE — Anesthesia Procedure Notes (Signed)
Procedure Name: LMA Insertion Date/Time: 08/03/2017 7:41 AM Performed by: Charmaine Downs, CRNA Pre-anesthesia Checklist: Patient identified, Patient being monitored, Emergency Drugs available, Timeout performed and Suction available Patient Re-evaluated:Patient Re-evaluated prior to induction Oxygen Delivery Method: Circle System Utilized Preoxygenation: Pre-oxygenation with 100% oxygen Induction Type: IV induction Ventilation: Mask ventilation without difficulty LMA: LMA inserted LMA Size: 4.0 Number of attempts: 1 Placement Confirmation: positive ETCO2 and breath sounds checked- equal and bilateral

## 2017-08-03 NOTE — Discharge Instructions (Signed)
Discharge Instructions: Shower per your regular routine. Take tylenol and ibuprofen as needed for pain control, alternating every 4-6 hours.  Take Roxicodone for breakthrough pain. Take colace for constipation related to narcotic pain medication. Do not pick at the dermabond glue on your incision sites.  No excessive stretching or movements that put tension on your back for the next 2 weeks.      Epidermal Cyst Removal, Care After Refer to this sheet in the next few weeks. These instructions provide you with information about caring for yourself after your procedure. Your health care provider may also give you more specific instructions. Your treatment has been planned according to current medical practices, but problems sometimes occur. Orndoff your health care provider if you have any problems or questions after your procedure. What can I expect after the procedure? After the procedure, it is common to have:  Soreness in the area where your cyst was removed.  Tightness or itching from your skin sutures.  Follow these instructions at home:  Take medicines only as directed by your health care provider.  If you were prescribed an antibiotic medicine, finish all of it even if you start to feel better.  Use antibiotic ointment as directed by your health care provider. Follow the instructions carefully.  There are many different ways to close and cover an incision, including stitches (sutures), skin glue, and adhesive strips. Follow your health care provider's instructions about: ? Incision care. ? Bandage (dressing) changes and removal. ? Incision closure removal.  Keep the bandage (dressing) dry until your health care provider says that it can be removed. Take sponge baths only. Ask your health care provider when you can start showering or taking a bath.  After your dressing is off, check your incision every day for signs of infection. Watch for: ? Redness, swelling, or pain. ? Fluid,  blood, or pus.  You can return to your normal activities. Do not do anything that stretches or puts pressure on your incision.  You can return to your normal diet.  Keep all follow-up visits as directed by your health care provider. This is important. Contact a health care provider if:  You have a fever.  Your incision bleeds.  You have redness, swelling, or pain in the incision area.  You have fluid, blood, or pus coming from your incision.  Your cyst comes back after surgery. This information is not intended to replace advice given to you by your health care provider. Make sure you discuss any questions you have with your health care provider. Document Released: 04/17/2014 Document Revised: 09/02/2015 Document Reviewed: 12/10/2013 Elsevier Interactive Patient Education  2018 [place] Anesthesia, Adult, Care After These instructions provide you with information about caring for yourself after your procedure. Your health care provider may also give you more specific instructions. Your treatment has been planned according to current medical practices, but problems sometimes occur. Loy your health care provider if you have any problems or questions after your procedure. What can I expect after the procedure? After the procedure, it is common to have:  Vomiting.  A sore throat.  Mental slowness.  It is common to feel:  Nauseous.  Cold or shivery.  Sleepy.  Tired.  Sore or achy, even in parts of your body where you did not have surgery.  Follow these instructions at home: For at least 24 hours after the procedure:  Do not: ? Participate in activities where you could fall or become injured. ?  Drive. ? Use heavy machinery. ? Drink alcohol. ? Take sleeping pills or medicines that cause drowsiness. ? Make important decisions or sign legal documents. ? Take care of children on your own.  Rest. Eating and drinking  If you vomit, drink water, juice,  or soup when you can drink without vomiting.  Drink enough fluid to keep your urine clear or pale yellow.  Make sure you have little or no nausea before eating solid foods.  Follow the diet recommended by your health care provider. General instructions  Have a responsible adult stay with you until you are awake and alert.  Return to your normal activities as told by your health care provider. Ask your health care provider what activities are safe for you.  Take over-the-counter and prescription medicines only as told by your health care provider.  If you smoke, do not smoke without supervision.  Keep all follow-up visits as told by your health care provider. This is important. Contact a health care provider if:  You continue to have nausea or vomiting at home, and medicines are not helpful.  You cannot drink fluids or start eating again.  You cannot urinate after 8-12 hours.  You develop a skin rash.  You have fever.  You have increasing redness at the site of your procedure. Get help right away if:  You have difficulty breathing.  You have chest pain.  You have unexpected bleeding.  You feel that you are having a life-threatening or urgent problem. This information is not intended to replace advice given to you by your health care provider. Make sure you discuss any questions you have with your health care provider. Document Released: 07/03/2000 Document Revised: 08/30/2015 Document Reviewed: 03/11/2015 Elsevier Interactive Patient Education  [employer].

## 2017-08-03 NOTE — Interval H&P Note (Signed)
History and Physical Interval Note:  5/94/7076 1:51 AM  [name]  has presented today for surgery, with the diagnosis of 3cm lipoma on back  The various methods of treatment have been discussed with the patient and family. After consideration of risks, benefits and other options for treatment, the patient has consented to  Procedure(s): EXCISION LIPOMA ON BACK (N/A) as a surgical intervention .  The patient's history has been reviewed, patient examined, no change in status, stable for surgery.  I have reviewed the patient's chart and labs.  Questions were answered to the patient's satisfaction.    No additional questions. Marked lipoma.   Virl Cagey

## 2017-08-06 ENCOUNTER — Encounter (HOSPITAL_COMMUNITY): Payer: Self-pay | Admitting: General Surgery

## 2017-08-17 ENCOUNTER — Encounter: Payer: Self-pay | Admitting: General Surgery

## 2017-08-21 ENCOUNTER — Ambulatory Visit (INDEPENDENT_AMBULATORY_CARE_PROVIDER_SITE_OTHER): Payer: Self-pay | Admitting: General Surgery

## 2017-08-21 ENCOUNTER — Encounter: Payer: Self-pay | Admitting: General Surgery

## 2017-08-21 VITALS — BP 134/75 | HR 88 | Temp 98.0°F | Resp 16 | Wt 171.0 lb

## 2017-08-21 DIAGNOSIS — T8131XA Disruption of external operation (surgical) wound, not elsewhere classified, initial encounter: Secondary | ICD-10-CM

## 2017-08-21 DIAGNOSIS — L723 Sebaceous cyst: Secondary | ICD-10-CM

## 2017-08-21 NOTE — Progress Notes (Signed)
[facility] Note   HPI:  37 y.o. Female presents to clinic for post-op follow-up evaluation after excision of what turned out to be a cyst from the back. She had a lot of inflammation and fibrosis in the area, and this removed. The cyst was very close to the skin, and attempts at saving the skin were made. This past few days this area has opened up.   Review of Systems:  Wound dehiscence  No fevers or chills All other review of systems: otherwise negative  Pathology: Diagnosis Cyst, excision, back - INFLAMMATION WITH GRANULATION TISSUE AND FIBROSIS. - NO EVIDENCE OF MALIGNANCY. - SEE MICROSCOPIC DESCRIPTION. Microscopic Comment There is a cavity which is lined by inflamed granulation tissue and fibrosis. The differential includes a ruptured epidermal cyst. There is no residual cyst epithelial lining present.  Vital Signs:  BP 134/75 (BP Location: Left Arm, Patient Position: Sitting, Cuff Size: Normal)   Pulse 88   Temp 98 F (36.7 C) (Temporal)   Resp 16   Wt 171 lb (77.6 kg)   BMI 31.28 kg/m    Physical Exam:  Physical Exam  Constitutional: She appears well-developed.  HENT:  Head: Normocephalic.  Cardiovascular: Normal rate.  Pulmonary/Chest: Effort normal.  Back incision healed with small area of fibrinous material superior to this and skin opened from the superficial tear on the skin, small bridge cut bet ween these two areas, small cavity about the size of a nickel and about 1/2 cm deep, no signs of infection or drainage  Vitals reviewed.   Laboratory studies: None    Imaging:  None    Assessment:  37 y.o. yo Female with a wound dehiscence after excision of a inflamed ruptured cyst from the back.   Plan:  - BID packing with saline dampened gauze  - Follow up in 2 weeks to look at  - Plans for [place], will assess wound and determine limitations based on the wound in 2 weeks   All of the above recommendations were discussed with the  patient and patient's family, and all of patient's and family's questions were answered to their expressed satisfaction.  Curlene Labrum, MD [facility] [address] [place], [place] [address] [phone] (office)

## 2017-08-21 NOTE — Patient Instructions (Signed)
Pack the wound twice daily with dampened gauze (dampen with saline).  You can buy saline, gauze, and qtips from the pharmacy.   Wound Dehiscence Wound dehiscence is when a surgical incision breaks open and does not heal properly after surgery. It usually happens 7-10 days after surgery. This can be a serious condition. It is important to identify and treat this condition early. What are the causes? Common causes of this condition include:  Stretching of the wound area. This may be caused by lifting, vomiting, violent coughing, or straining during bowel movements.  Wound infection.  Early removal or rupture of stitches (sutures).  What increases the risk? The following factors may make you more likely to develop this condition:  Obesity.  Lung disease.  Smoking.  Poor nutrition.  Contamination during surgery.  Medical problems that make it harder to heal, such as diabetes or autoimmune diseases.  What are the signs or symptoms? Symptoms of this condition include:  Bleeding or drainage from the wound.  Pain.  Fever.  The wound starting to break open.  How is this diagnosed? Your health care provider may diagnose wound dehiscence by monitoring the incision and noting any changes in the wound. These changes can include a gap in the incision and an increase in drainage or pain. The health care provider may ask you if you have noticed any stretching or tearing of the wound. You may also have tests, including:  Wound culture tests to determine if there is an infection.  Imaging studies, such as an MRI scan or CT scan, to determine if there is a collection of pus or fluid in the wound area.  Blood tests to check for infection and inflammation.  How is this treated? Treatment for this condition may include:  Wound care to keep the incision clean and help it heal from the inside out.  Surgical repair.  Antibiotic medicine to treat or prevent infection.  Medicines to  reduce pain and swelling.  Follow these instructions at home: Medicines  Take over-the-counter and prescription medicines only as told by your health care provider. Taking pain medicine 30 minutes before changing a bandage (dressing) can help relieve pain.  If you were prescribed an antibiotic, take it as told by your health care provider. Do not stop taking the antibiotic even if you start to feel better.  Use anti-itch medicine as told by your health care provider. The wound may feel itchy when it is healing. Wound care  Follow instructions from your health care provider about how to take care of your wound. Make sure you: ? Wash your hands with soap and water before you change your bandage (dressing) or wash the wound area. If soap and water are not available, use hand sanitizer. ? Gently wash the wound area with mild soap and water 2 times a day, or as directed. Rinse off the soap. Pat the area dry with a clean towel. Do not rub the wound. ? Change the dressing and the packing inside as told by your health care provider.  Do not scratch or pick at the wound.  Check your wound area every day for signs of infection. Check for: ? More redness, swelling, or pain. ? More fluid or blood. ? Warmth. ? Pus or a bad smell. Activity  Avoid exercises that make you sweat heavily or could cause stretching of your wound.  Do not lift anything that is heavier than 10 lb (4.5 kg) until the wound is healed or until your  health care provider says that it is safe. General instructions  Do not take baths, swim, or use a hot tub until your health care provider approves. You may take showers.  Keep all follow-up visits as told by your health care provider. This is important. Contact a health care provider if:  Your wound does not seem to be healing properly.  You have a fever. Get help right away if:  You have more redness, swelling, or pain around your wound.  You have more fluid coming from  your wound.  Your wound feels warm to the touch.  You have pus or a bad smell coming from your wound.  Your wound breaks open farther.  You have redness streaking or spreading from your wound.  You have excessive bleeding from your wound. Summary  Wound dehiscence is when a surgical incision breaks open and does not heal properly after surgery.  Your health care provider may diagnose wound dehiscence by monitoring the incision and noting any changes in the wound.  Follow instructions from your health care provider about how to take care of your wound.  Check your wound area every day for signs of infection, such as redness, swelling, pain, fluid, blood, warmth, pus, or a bad smell.  If you were prescribed antibiotic medicine, take it as told by your health care provider. Do not stop taking the antibiotic even if you start to feel better. This information is not intended to replace advice given to you by your health care provider. Make sure you discuss any questions you have with your health care provider. Document Released: 06/17/2003 Document Revised: 03/01/2016 Document Reviewed: 03/01/2016 Elsevier Interactive Patient Education  2017 [employer].

## 2017-09-04 ENCOUNTER — Ambulatory Visit: Payer: 59 | Admitting: General Surgery

## 2018-01-10 ENCOUNTER — Ambulatory Visit: Payer: 59 | Admitting: Adult Health

## 2019-02-14 ENCOUNTER — Ambulatory Visit: Payer: 59 | Admitting: Adult Health

## 2019-02-14 ENCOUNTER — Encounter: Payer: Self-pay | Admitting: Adult Health

## 2019-02-14 ENCOUNTER — Other Ambulatory Visit: Payer: Self-pay

## 2019-02-14 VITALS — BP 124/84 | HR 88 | Ht 63.0 in | Wt 193.4 lb

## 2019-02-14 DIAGNOSIS — Z6834 Body mass index (BMI) 34.0-34.9, adult: Secondary | ICD-10-CM | POA: Insufficient documentation

## 2019-02-14 DIAGNOSIS — Z713 Dietary counseling and surveillance: Secondary | ICD-10-CM | POA: Diagnosis not present

## 2019-02-14 MED ORDER — PHENTERMINE HCL 37.5 MG PO TABS: 37.5000 mg | ORAL_TABLET | Freq: Every day | ORAL | 0 refills | Status: DC

## 2019-02-14 NOTE — Progress Notes (Signed)
  Subjective:     Patient ID: [name], female   DOB: 28-Mar-1981, 38 y.o.   MRN: [id number]  HPI [name] is a 38 year old white female, married, G4P4 in complaining of weight gain, and wants to get adipex. Daughter got learners permit today and has been accepted to [facility] in the fall.   Review of Systems +weight gain, wants adipex  Reviewed past medical,surgical, social and family history. Reviewed medications and allergies.     Objective:   Physical Exam BP 124/84 (BP Location: Left Arm, Patient Position: Sitting, Cuff Size: Normal)   Pulse 88   Ht 5\' 3"  (1.6 m)   Wt 193 lb 6.4 oz (87.7 kg)   BMI 34.26 kg/m   Skin warm and dry. Lungs: clear to ausculation bilaterally. Cardiovascular: regular rate and rhythm.   Fall risk is low PHQ 2 score 0.  Assessment:     1. Weight loss counseling, encounter for   2. Body mass index 34.0-34.9, adult       Plan:     Meds ordered this encounter  Medications  . phentermine (ADIPEX-P) 37.5 MG tablet    Sig: Take 1 tablet (37.5 mg total) by mouth daily before breakfast.    Dispense:  30 tablet    Refill:  0    Order Specific Question:   Supervising Provider    Answer:   Florian Buff [[id number]]  Return 12/8 for physical and weight check

## 2019-03-18 ENCOUNTER — Encounter: Payer: Self-pay | Admitting: Adult Health

## 2019-03-18 ENCOUNTER — Other Ambulatory Visit: Payer: Self-pay

## 2019-03-18 ENCOUNTER — Ambulatory Visit (INDEPENDENT_AMBULATORY_CARE_PROVIDER_SITE_OTHER): Payer: 59 | Admitting: Adult Health

## 2019-03-18 VITALS — BP 110/68 | HR 91 | Ht 62.75 in | Wt 183.5 lb

## 2019-03-18 DIAGNOSIS — Z01419 Encounter for gynecological examination (general) (routine) without abnormal findings: Secondary | ICD-10-CM | POA: Diagnosis not present

## 2019-03-18 DIAGNOSIS — Z713 Dietary counseling and surveillance: Secondary | ICD-10-CM

## 2019-03-18 DIAGNOSIS — Z6832 Body mass index (BMI) 32.0-32.9, adult: Secondary | ICD-10-CM | POA: Diagnosis not present

## 2019-03-18 MED ORDER — PHENTERMINE HCL 37.5 MG PO TABS: 37.5000 mg | ORAL_TABLET | Freq: Every day | ORAL | 0 refills | Status: DC

## 2019-03-18 NOTE — Progress Notes (Signed)
Patient ID: [name], female   DOB: 02/05/1981, 38 y.o.   MRN: [id number] History of Present Illness: [name] is a 38 year old white female, married, G4P4 in for a well woman gyn exam and weight check.She had a normal pap with negative HPV 06/11/17.    Current Medications, Allergies, Past Medical History, Past Surgical History, Family History and Social History were reviewed in [employer] record.     Review of Systems: Patient denies any headaches, hearing loss, fatigue, blurred vision, shortness of breath, chest pain, abdominal pain, problems with bowel movements, urination, or intercourse. No joint pain or mood swings.    Physical Exam:BP 110/68 (BP Location: Left Arm, Patient Position: Sitting, Cuff Size: Normal)   Pulse 91   Ht 5' 2.75" (1.594 m)   Wt 183 lb 8 oz (83.2 kg)   BMI 32.77 kg/m  General:  Well developed, well nourished, no acute distress Skin:  Warm and dry Neck:  Midline trachea, normal thyroid, good ROM, no lymphadenopathy Lungs; Clear to auscultation bilaterally Breast:  No dominant palpable mass, retraction, or nipple discharge Cardiovascular: Regular rate and rhythm Abdomen:  Soft, non tender, no hepatosplenomegaly Pelvic:  External genitalia is normal in appearance, no lesions.  The vagina is normal in appearance. Urethra has no lesions or masses. The cervix is bulbous.  Uterus is felt to be normal size, shape, and contour.  No adnexal masses or tenderness noted.Bladder is non tender, no masses felt. Extremities/musculoskeletal:  No swelling or varicosities noted, no clubbing or cyanosis Psych:  No mood changes, alert and cooperative,seems happy Fall risk is low PHQ 2 score is 0, Examination chaperoned by Levy Pupa LPN Has lost 10 lbs and wants to continue adipex.  Impression and Plan: 1. Encounter for well woman exam with routine gynecological exam Physical in 1 year Pap in 2022 Mammogram at 42 Fasting labs in 4 weeks    2.  Weight loss counseling, encounter for Refill adipex Meds ordered this encounter  Medications  . phentermine (ADIPEX-P) 37.5 MG tablet    Sig: Take 1 tablet (37.5 mg total) by mouth daily before breakfast.    Dispense:  30 tablet    Refill:  0    Order Specific Question:   Supervising Provider    Answer:   Tania Ade H [[id number]]  Follow  Up in 4 weeks for weight and BP check and do fasting labs then   3. Body mass index 32.0-32.9, adult

## 2019-04-17 ENCOUNTER — Telehealth (INDEPENDENT_AMBULATORY_CARE_PROVIDER_SITE_OTHER): Payer: 59 | Admitting: Adult Health

## 2019-04-17 ENCOUNTER — Encounter: Payer: Self-pay | Admitting: Adult Health

## 2019-04-17 ENCOUNTER — Other Ambulatory Visit: Payer: Self-pay

## 2019-04-17 VITALS — BP 122/82 | HR 92 | Ht 62.0 in | Wt 180.0 lb

## 2019-04-17 DIAGNOSIS — Z713 Dietary counseling and surveillance: Secondary | ICD-10-CM

## 2019-04-17 DIAGNOSIS — Z6832 Body mass index (BMI) 32.0-32.9, adult: Secondary | ICD-10-CM

## 2019-04-17 MED ORDER — PHENTERMINE HCL 37.5 MG PO TABS: 37.5000 mg | ORAL_TABLET | Freq: Every day | ORAL | 0 refills | Status: DC

## 2019-04-17 NOTE — Progress Notes (Signed)
Patient ID: [name], female   DOB: [birth year], 39 y.o.   MRN: [id number]   TELEHEALTH VIRTUAL GYNECOLOGY VISIT ENCOUNTER NOTE  I connected with [name] on [id number] at  1:30 PM EST by telephone at home and verified that I am speaking with the correct person using two identifiers.   I discussed the limitations, risks, security and privacy concerns of performing an evaluation and management service by telephone and the availability of in person appointments. I also discussed with the patient that there may be a patient responsible charge related to this service. The patient expressed understanding and agreed to proceed.   History:  [name] is a 39 y.o. [phone] female being evaluated today for weight loss on adipex and has lost 3.5 more lbs for total of 13.5 lbs.. She denies any headaches, dizziness, chest pain or problems sleeping or other concerns.       Past Medical History:  Diagnosis Date  . Body mass index 28.0-28.9, adult 07/14/2015  . Breast nodule 11/27/2013   Has firm mobile nodule at 11 0' clock 2 fb from areola and a tender nodule at 1 o'clock  5 fb in right breast  . Dysmenorrhea 03/16/2015  . Menorrhagia with regular cycle 03/16/2015  . Obesity 09/25/2014   Past Surgical History:  Procedure Laterality Date  . CHOLECYSTECTOMY    . DILITATION & CURRETTAGE/HYSTROSCOPY WITH NOVASURE ABLATION N/A 03/31/2015   Procedure: DILATATION & CURETTAGE/HYSTEROSCOPY WITH NOVASURE ABLATION; NOVASURE UTERINE CAVITY LENGTH 4.7 CM, WIDTH 3.7 CM, POWER 96 WATTS, TIME 2 MINUTES;  Surgeon: [name], MD;  Location: [facility];  Service: Gynecology;  Laterality: N/A;  . I&D left breast     mastitis  . MASS EXCISION N/A 08/03/2017   Procedure: EXCISION CYST ON BACK;  Surgeon: Virl Cagey, MD;  Location: [facility];  Service: General;  Laterality: N/A;  . TUBAL LIGATION     The following portions of the patient's history were reviewed and updated as appropriate: allergies, current  medications, past family history, past medical history, past social history, past surgical history and problem list.   Health Maintenance:  Normal pap and negative HRHPV on 3//4/19.  Review of Systems:  Pertinent items noted in HPI and remainder of comprehensive ROS otherwise negative.  Physical Exam:   General:  Alert, oriented and cooperative.   Mental Status: Normal mood and affect perceived. Normal judgment and thought content.  Physical exam deferred due to nature of the encounter  Labs and Imaging No results found for this or any previous visit (from the past 336 hour(s)). No results found.    Assessment and Plan:     1. Weight loss counseling, encounter for Continue weight loss efforts Will refill adipex Meds ordered this encounter  Medications  . phentermine (ADIPEX-P) 37.5 MG tablet    Sig: Take 1 tablet (37.5 mg total) by mouth daily before breakfast.    Dispense:  30 tablet    Refill:  0    Order Specific Question:   Supervising Provider    Answer:   Tania Ade H [[id number]]  Follow up in 4 weeks   2. Body mass index 32.0-32.9, adult       I discussed the assessment and treatment plan with the patient. The patient was provided an opportunity to ask questions and all were answered. The patient agreed with the plan and demonstrated an understanding of the instructions.   The patient was advised to [place] back or seek an  in-person evaluation/go to the ED if the symptoms worsen or if the condition fails to improve as anticipated.  I provided 5 minutes of non-face-to-face time during this encounter.   Derrek Monaco, NP [facility], [place]

## 2019-04-18 ENCOUNTER — Ambulatory Visit: Payer: 59 | Admitting: Adult Health

## 2019-05-15 ENCOUNTER — Other Ambulatory Visit: Payer: Self-pay

## 2019-05-15 ENCOUNTER — Telehealth (INDEPENDENT_AMBULATORY_CARE_PROVIDER_SITE_OTHER): Payer: 59 | Admitting: Adult Health

## 2019-05-15 ENCOUNTER — Encounter: Payer: Self-pay | Admitting: Adult Health

## 2019-05-15 VITALS — BP 111/64 | HR 88 | Ht 62.0 in | Wt 177.6 lb

## 2019-05-15 DIAGNOSIS — Z6832 Body mass index (BMI) 32.0-32.9, adult: Secondary | ICD-10-CM | POA: Diagnosis not present

## 2019-05-15 DIAGNOSIS — Z713 Dietary counseling and surveillance: Secondary | ICD-10-CM | POA: Diagnosis not present

## 2019-05-15 MED ORDER — PHENTERMINE HCL 37.5 MG PO TABS: 37.5000 mg | ORAL_TABLET | Freq: Every day | ORAL | 0 refills | Status: DC

## 2019-05-15 NOTE — Progress Notes (Signed)
Patient ID: [name], female   DOB: 1980/11/18, 39 y.o.   MRN: [id number]   TELEHEALTH VIRTUAL GYNECOLOGY VISIT ENCOUNTER NOTE  I connected with [name] on [id number] at 11:50 AM EST by telephone at work and verified that I am speaking with the correct person using two identifiers.   I discussed the limitations, risks, security and privacy concerns of performing an evaluation and management service by telephone and the availability of in person appointments. I also discussed with the patient that there may be a patient responsible charge related to this service. The patient expressed understanding and agreed to proceed.   History:  [name] is a 39 y.o. [phone] female being evaluated today for a weight and BP check. She denies any headache, trouble sleeping  or other concerns.  She has lost total of 16 lbs so far.     Past Medical History:  Diagnosis Date  . Body mass index 28.0-28.9, adult 07/14/2015  . Breast nodule 11/27/2013   Has firm mobile nodule at 11 0' clock 2 fb from areola and a tender nodule at 1 o'clock  5 fb in right breast  . Dysmenorrhea 03/16/2015  . Menorrhagia with regular cycle 03/16/2015  . Obesity 09/25/2014   Past Surgical History:  Procedure Laterality Date  . CHOLECYSTECTOMY    . DILITATION & CURRETTAGE/HYSTROSCOPY WITH NOVASURE ABLATION N/A 03/31/2015   Procedure: DILATATION & CURETTAGE/HYSTEROSCOPY WITH NOVASURE ABLATION; NOVASURE UTERINE CAVITY LENGTH 4.7 CM, WIDTH 3.7 CM, POWER 96 WATTS, TIME 2 MINUTES;  Surgeon: [name], MD;  Location: [facility];  Service: Gynecology;  Laterality: N/A;  . I&D left breast     mastitis  . MASS EXCISION N/A 08/03/2017   Procedure: EXCISION CYST ON BACK;  Surgeon: Virl Cagey, MD;  Location: [facility];  Service: General;  Laterality: N/A;  . TUBAL LIGATION     The following portions of the patient's history were reviewed and updated as appropriate: allergies, current medications, past family history, past  medical history, past social history, past surgical history and problem list.   Health Maintenance:  Normal pap and negative HRHPV on 06/11/17.    Review of Systems:  Pertinent items noted in HPI and remainder of comprehensive ROS otherwise negative.  Physical Exam:   General:  Alert, oriented and cooperative.   Mental Status: Normal mood and affect perceived. Normal judgment and thought content.  Physical exam deferred due to nature of the encounter BP 111/64 (BP Location: Left Arm)   Pulse 88   Ht 5\' 2"  (1.575 m)   Wt 177 lb 9.6 oz (80.6 kg)   BMI 32.48 kg/m   Labs and Imaging No results found for this or any previous visit (from the past 336 hour(s)). No results found.    Assessment and Plan:     1. Weight loss counseling, encounter for Continue weight loss efforts Will rx adipex, then will take a break and try on her own Meds ordered this encounter  Medications  . phentermine (ADIPEX-P) 37.5 MG tablet    Sig: Take 1 tablet (37.5 mg total) by mouth daily before breakfast.    Dispense:  30 tablet    Refill:  0    Order Specific Question:   Supervising Provider    Answer:   Tania Ade H [[id number]]   Follow up in 4 weeks for tele visit  2. Body mass index 32.0-32.9, adult       I discussed the assessment and treatment plan  with the patient. The patient was provided an opportunity to ask questions and all were answered. The patient agreed with the plan and demonstrated an understanding of the instructions.   The patient was advised to [place] back or seek an in-person evaluation/go to the ED if the symptoms worsen or if the condition fails to improve as anticipated.  I provided 5  minutes of non-face-to-face time during this encounter.   Derrek Monaco, NP Center for [facility], [place]

## 2019-06-12 ENCOUNTER — Other Ambulatory Visit: Payer: Self-pay

## 2019-06-12 ENCOUNTER — Telehealth (INDEPENDENT_AMBULATORY_CARE_PROVIDER_SITE_OTHER): Payer: 59 | Admitting: Adult Health

## 2019-06-12 ENCOUNTER — Encounter: Payer: Self-pay | Admitting: Adult Health

## 2019-06-12 VITALS — BP 113/58 | HR 91 | Ht 62.0 in | Wt 180.2 lb

## 2019-06-12 DIAGNOSIS — Z6832 Body mass index (BMI) 32.0-32.9, adult: Secondary | ICD-10-CM | POA: Diagnosis not present

## 2019-06-12 DIAGNOSIS — Z713 Dietary counseling and surveillance: Secondary | ICD-10-CM | POA: Diagnosis not present

## 2019-06-12 NOTE — Progress Notes (Signed)
Patient ID: [name], female   DOB: [birth year], 39 y.o.   MRN: [id number]   TELEHEALTH VIRTUAL GYNECOLOGY VISIT ENCOUNTER NOTE  I connected with Sabrenia Kriebel Slemmer on [id number] at 11:50 AM EST by telephone at home and verified that I am speaking with the correct person using two identifiers.   I discussed the limitations, risks, security and privacy concerns of performing an evaluation and management service by telephone and the availability of in person appointments. I also discussed with the patient that there may be a patient responsible charge related to this service. The patient expressed understanding and agreed to proceed.   History:  [name] is a 39 y.o. [phone] female being evaluated today for weight loss and has gained a few lbs, will take a break off of adipex for a while and she how she does. She denies any headache, dizziness or other concerns.       Past Medical History:  Diagnosis Date  . Body mass index 28.0-28.9, adult 07/14/2015  . Breast nodule 11/27/2013   Has firm mobile nodule at 11 0' clock 2 fb from areola and a tender nodule at 1 o'clock  5 fb in right breast  . Dysmenorrhea 03/16/2015  . Menorrhagia with regular cycle 03/16/2015  . Obesity 09/25/2014   Past Surgical History:  Procedure Laterality Date  . CHOLECYSTECTOMY    . DILITATION & CURRETTAGE/HYSTROSCOPY WITH NOVASURE ABLATION N/A 03/31/2015   Procedure: DILATATION & CURETTAGE/HYSTEROSCOPY WITH NOVASURE ABLATION; NOVASURE UTERINE CAVITY LENGTH 4.7 CM, WIDTH 3.7 CM, POWER 96 WATTS, TIME 2 MINUTES;  Surgeon: [name], MD;  Location: [facility];  Service: Gynecology;  Laterality: N/A;  . I&D left breast     mastitis  . MASS EXCISION N/A 08/03/2017   Procedure: EXCISION CYST ON BACK;  Surgeon: Virl Cagey, MD;  Location: [facility];  Service: General;  Laterality: N/A;  . TUBAL LIGATION     The following portions of the patient's history were reviewed and updated as appropriate: allergies, current  medications, past family history, past medical history, past social history, past surgical history and problem list.   Health Maintenance:  Normal pap and negative HRHPV on 06/11/17.  Review of Systems:  Pertinent items noted in HPI and remainder of comprehensive ROS otherwise negative.  Physical Exam:   General:  Alert, oriented and cooperative.   Mental Status: Normal mood and affect perceived. Normal judgment and thought content.  Physical exam deferred due to nature of the encounter BP (!) 113/58   Pulse 91   Ht 5\' 2"  (1.575 m)   Wt 180 lb 3.2 oz (81.7 kg)   BMI 32.96 kg/m  Labs and Imaging No results found for this or any previous visit (from the past 336 hour(s)). No results found.    Assessment and Plan:     1. Weight loss counseling, encounter for Will continue weight loss without adipex for now   2. Body mass index 32.0-32.9, adult       I discussed the assessment and treatment plan with the patient. The patient was provided an opportunity to ask questions and all were answered. The patient agreed with the plan and demonstrated an understanding of the instructions.   The patient was advised to [place] back or seek an in-person evaluation/go to the ED if the symptoms worsen or if the condition fails to improve as anticipated.  I provided 5 minutes of non-face-to-face time during this encounter.   Derrek Monaco, NP [facility], [facility]

## 2019-12-01 ENCOUNTER — Telehealth (INDEPENDENT_AMBULATORY_CARE_PROVIDER_SITE_OTHER): Payer: 59 | Admitting: Adult Health

## 2019-12-01 ENCOUNTER — Encounter: Payer: Self-pay | Admitting: Adult Health

## 2019-12-01 VITALS — BP 118/63 | HR 86 | Ht 63.0 in | Wt 191.6 lb

## 2019-12-01 DIAGNOSIS — Z6833 Body mass index (BMI) 33.0-33.9, adult: Secondary | ICD-10-CM | POA: Insufficient documentation

## 2019-12-01 DIAGNOSIS — Z713 Dietary counseling and surveillance: Secondary | ICD-10-CM

## 2019-12-01 MED ORDER — PHENTERMINE HCL 37.5 MG PO TABS: 37.5000 mg | ORAL_TABLET | Freq: Every day | ORAL | 0 refills | Status: DC

## 2019-12-01 NOTE — Progress Notes (Addendum)
Patient ID: [name], female   DOB: November 15, 1980, 39 y.o.   MRN: [id number]   TELEHEALTH GYNECOLOGY VISIT ENCOUNTER NOTE  I connected with [name] on 67/89/37 at 11:50 AM EDT by telephone at home and verified that I am speaking with the correct person using two identifiers.   I discussed the limitations, risks, security and privacy concerns of performing an evaluation and management service by telephone and the availability of in person appointments. I also discussed with the patient that there may be a patient responsible charge related to this service. The patient expressed understanding and agreed to proceed.   History:  [name] is a 39 y.o. [phone] female being evaluated today for getting on adipex to help with weight loss, had right  CTS 11/28/19  She denies any headaches or  other concerns.       Past Medical History:  Diagnosis Date  . Body mass index 28.0-28.9, adult 07/14/2015  . Breast nodule 11/27/2013   Has firm mobile nodule at 11 0' clock 2 fb from areola and a tender nodule at 1 o'clock  5 fb in right breast  . Dysmenorrhea 03/16/2015  . Menorrhagia with regular cycle 03/16/2015  . Obesity 09/25/2014   Past Surgical History:  Procedure Laterality Date  . CARPAL TUNNEL RELEASE Right   . CHOLECYSTECTOMY    . DILITATION & CURRETTAGE/HYSTROSCOPY WITH NOVASURE ABLATION N/A 03/31/2015   Procedure: DILATATION & CURETTAGE/HYSTEROSCOPY WITH NOVASURE ABLATION; NOVASURE UTERINE CAVITY LENGTH 4.7 CM, WIDTH 3.7 CM, POWER 96 WATTS, TIME 2 MINUTES;  Surgeon: [name], MD;  Location: [facility];  Service: Gynecology;  Laterality: N/A;  . I&D left breast     mastitis  . MASS EXCISION N/A 08/03/2017   Procedure: EXCISION CYST ON BACK;  Surgeon: Virl Cagey, MD;  Location: [facility];  Service: General;  Laterality: N/A;  . TUBAL LIGATION     The following portions of the patient's history were reviewed and updated as appropriate: allergies, current medications, past family  history, past medical history, past social history, past surgical history and problem list.   Health Maintenance:  Normal pap and negative HRHPV on 06/11/17. Mammogram at 42  Review of Systems:  Pertinent items noted in HPI and remainder of comprehensive ROS otherwise negative.  Physical Exam:   General:  Alert, oriented and cooperative.   Mental Status: Normal mood and affect perceived. Normal judgment and thought content.  Physical exam deferred due to nature of the encounter BP 118/63 (BP Location: Left Arm, Patient Position: Sitting, Cuff Size: Normal)   Pulse 86   Ht 5\' 3"  (1.6 m)   Wt 191 lb 9.6 oz (86.9 kg)   BMI 33.94 kg/m     Labs and Imaging No results found for this or any previous visit (from the past 336 hour(s)). No results found.    Assessment and Plan:     1. Weight loss counseling, encounter for Will rx adipex Meds ordered this encounter  Medications  . phentermine (ADIPEX-P) 37.5 MG tablet    Sig: Take 1 tablet (37.5 mg total) by mouth daily before breakfast.    Dispense:  30 tablet    Refill:  0    Order Specific Question:   Supervising Provider    Answer:   Tania Ade H [[id number]]  Increase water and decrease portions   2. Body mass index 33.0-33.9, adult        I discussed the assessment and treatment plan with the  patient. The patient was provided an opportunity to ask questions and all were answered. The patient agreed with the plan and demonstrated an understanding of the instructions.   The patient was advised to [place] back or seek an in-person evaluation/go to the ED if the symptoms worsen or if the condition fails to improve as anticipated.  I provided 5 minutes of non-face-to-face time during this encounter.   Derrek Monaco, NP Center for [facility], [place]

## 2019-12-29 ENCOUNTER — Encounter: Payer: Self-pay | Admitting: Adult Health

## 2019-12-29 ENCOUNTER — Telehealth (INDEPENDENT_AMBULATORY_CARE_PROVIDER_SITE_OTHER): Payer: 59 | Admitting: Adult Health

## 2019-12-29 VITALS — BP 112/64 | HR 88 | Ht 62.0 in | Wt 184.6 lb

## 2019-12-29 DIAGNOSIS — Z713 Dietary counseling and surveillance: Secondary | ICD-10-CM | POA: Diagnosis not present

## 2019-12-29 DIAGNOSIS — Z6833 Body mass index (BMI) 33.0-33.9, adult: Secondary | ICD-10-CM

## 2019-12-29 MED ORDER — PHENTERMINE HCL 37.5 MG PO TABS: 37.5000 mg | ORAL_TABLET | Freq: Every day | ORAL | 0 refills | Status: DC

## 2019-12-29 NOTE — Progress Notes (Addendum)
Patient ID: [name], female   DOB: 1980/09/24, 39 y.o.   MRN: [id number]   TELEHEALTH GYNECOLOGY VISIT ENCOUNTER NOTE  I connected with [name] on 67/61/95 at 11:50 AM EDT by telephone at home and verified that I am speaking with the correct person using two identifiers.   I discussed the limitations, risks, security and privacy concerns of performing an evaluation and management service by telephone and the availability of in person appointments. I also discussed with the patient that there may be a patient responsible charge related to this service. The patient expressed understanding and agreed to proceed.   History:  [name] is a 39 y.o. [phone] female being evaluated today for weight and BP check and has lost 7 lbs She denies any headaches or trouble with sleep or other concerns.      Past Medical History:  Diagnosis Date  . Body mass index 28.0-28.9, adult 07/14/2015  . Breast nodule 11/27/2013   Has firm mobile nodule at 11 0' clock 2 fb from areola and a tender nodule at 1 o'clock  5 fb in right breast  . Dysmenorrhea 03/16/2015  . Menorrhagia with regular cycle 03/16/2015  . Obesity 09/25/2014   Past Surgical History:  Procedure Laterality Date  . CARPAL TUNNEL RELEASE Right   . CHOLECYSTECTOMY    . DILITATION & CURRETTAGE/HYSTROSCOPY WITH NOVASURE ABLATION N/A 03/31/2015   Procedure: DILATATION & CURETTAGE/HYSTEROSCOPY WITH NOVASURE ABLATION; NOVASURE UTERINE CAVITY LENGTH 4.7 CM, WIDTH 3.7 CM, POWER 96 WATTS, TIME 2 MINUTES;  Surgeon: [name], MD;  Location: [facility];  Service: Gynecology;  Laterality: N/A;  . I&D left breast     mastitis  . MASS EXCISION N/A 08/03/2017   Procedure: EXCISION CYST ON BACK;  Surgeon: Virl Cagey, MD;  Location: [facility];  Service: General;  Laterality: N/A;  . TUBAL LIGATION     The following portions of the patient's history were reviewed and updated as appropriate: allergies, current medications, past family history,  past medical history, past social history, past surgical history and problem list.   Health Maintenance:  Normal pap and negative HRHPV on 06/11/17  Review of Systems:  Pertinent items noted in HPI and remainder of comprehensive ROS otherwise negative.  Physical Exam:   General:  Alert, oriented and cooperative.   Mental Status: Normal mood and affect perceived. Normal judgment and thought content.  Physical exam deferred due to nature of the encounter BP 112/64 (BP Location: Right Arm, Patient Position: Sitting, Cuff Size: Normal)   Pulse 88   Ht 5\' 2"  (1.575 m)   Wt 184 lb 9.6 oz (83.7 kg)   BMI 33.76 kg/m    Upstream - 12/29/19 1216      Pregnancy Intention Screening   Does the patient want to become pregnant in the next year? No    Does the patient's partner want to become pregnant in the next year? No    Would the patient like to discuss contraceptive options today? No      Contraception Wrap Up   Current Method Female Sterilization    End Method Female Sterilization    Contraception Counseling Provided No          Labs and Imaging No results found for this or any previous visit (from the past 336 hour(s)). No results found.    Assessment and Plan:     1. Weight loss counseling, encounter for Will refill adipex Meds ordered this encounter  Medications  .  phentermine (ADIPEX-P) 37.5 MG tablet    Sig: Take 1 tablet (37.5 mg total) by mouth daily before breakfast.    Dispense:  30 tablet    Refill:  0    Order Specific Question:   Supervising Provider    Answer:   Tania Ade H [[id number]]   Continue weight loss efforts Follow up in 4 weeks   2. Body mass index 33.0-33.9, adult        I discussed the assessment and treatment plan with the patient. The patient was provided an opportunity to ask questions and all were answered. The patient agreed with the plan and demonstrated an understanding of the instructions.   The patient was advised to [place] back or seek an  in-person evaluation/go to the ED if the symptoms worsen or if the condition fails to improve as anticipated.  I provided 5 minutes of non-face-to-face time during this encounter. I was in my office at [facility].   Derrek Monaco, NP Center for [facility], [place]

## 2020-01-26 ENCOUNTER — Telehealth: Payer: 59 | Admitting: Adult Health

## 2020-01-30 ENCOUNTER — Encounter: Payer: Self-pay | Admitting: Adult Health

## 2020-01-30 ENCOUNTER — Other Ambulatory Visit: Payer: Self-pay

## 2020-01-30 ENCOUNTER — Telehealth (INDEPENDENT_AMBULATORY_CARE_PROVIDER_SITE_OTHER): Payer: 59 | Admitting: Adult Health

## 2020-01-30 VITALS — BP 116/64 | HR 81 | Ht 62.0 in | Wt 179.6 lb

## 2020-01-30 DIAGNOSIS — Z713 Dietary counseling and surveillance: Secondary | ICD-10-CM

## 2020-01-30 DIAGNOSIS — Z6832 Body mass index (BMI) 32.0-32.9, adult: Secondary | ICD-10-CM | POA: Diagnosis not present

## 2020-01-30 MED ORDER — PHENTERMINE HCL 37.5 MG PO TABS: 37.5000 mg | ORAL_TABLET | Freq: Every day | ORAL | 0 refills | Status: DC

## 2020-01-30 NOTE — Progress Notes (Signed)
Patient ID: [name], female   DOB: 07-15-1980, 39 y.o.   MRN: [id number]   TELEHEALTH GYNECOLOGY VISIT ENCOUNTER NOTE  I connected with [name] on 23/30/07 at 11:30 AM EDT by telephone at home and verified that I am speaking with the correct person using two identifiers.   I discussed the limitations, risks, security and privacy concerns of performing an evaluation and management service by telephone and the availability of in person appointments. I also discussed with the patient that there may be a patient responsible charge related to this service. The patient expressed understanding and agreed to proceed.   History:  [name] is a 39 y.o. [phone] female being evaluated today for weight and BP, she is on adipex and wants to refill 1 more time.She denies any headaches, trouble sleeping or other concerns.       Past Medical History:  Diagnosis Date  . Body mass index 28.0-28.9, adult 07/14/2015  . Breast nodule 11/27/2013   Has firm mobile nodule at 11 0' clock 2 fb from areola and a tender nodule at 1 o'clock  5 fb in right breast  . Dysmenorrhea 03/16/2015  . Menorrhagia with regular cycle 03/16/2015  . Obesity 09/25/2014   Past Surgical History:  Procedure Laterality Date  . CARPAL TUNNEL RELEASE Right   . CHOLECYSTECTOMY    . DILITATION & CURRETTAGE/HYSTROSCOPY WITH NOVASURE ABLATION N/A 03/31/2015   Procedure: DILATATION & CURETTAGE/HYSTEROSCOPY WITH NOVASURE ABLATION; NOVASURE UTERINE CAVITY LENGTH 4.7 CM, WIDTH 3.7 CM, POWER 96 WATTS, TIME 2 MINUTES;  Surgeon: [name], MD;  Location: [facility];  Service: Gynecology;  Laterality: N/A;  . I&D left breast     mastitis  . MASS EXCISION N/A 08/03/2017   Procedure: EXCISION CYST ON BACK;  Surgeon: Virl Cagey, MD;  Location: [facility];  Service: General;  Laterality: N/A;  . TUBAL LIGATION     The following portions of the patient's history were reviewed and updated as appropriate: allergies, current medications,  past family history, past medical history, past social history, past surgical history and problem list.   Health Maintenance:  Normal pap and negative HRHPV on 06/11/17  Review of Systems:  Pertinent items noted in HPI and remainder of comprehensive ROS otherwise negative.  Physical Exam:   General:  Alert, oriented and cooperative.   Mental Status: Normal mood and affect perceived. Normal judgment and thought content.  Physical exam deferred due to nature of the encounter BP 116/64 (BP Location: Left Arm)   Pulse 81   Ht 5\' 2"  (1.575 m)   Wt 179 lb 9.6 oz (81.5 kg)   BMI 32.85 kg/m   She has lost 12 lbs  Upstream - 01/30/20 1154      Pregnancy Intention Screening   Does the patient want to become pregnant in the next year? No    Does the patient's partner want to become pregnant in the next year? No    Would the patient like to discuss contraceptive options today? No      Contraception Wrap Up   Current Method Female Sterilization    End Method Female Sterilization    Contraception Counseling Provided No           Labs and Imaging No results found for this or any previous visit (from the past 336 hour(s)). No results found.    Assessment and Plan:     1. Weight loss counseling, encounter for Will refill adipex Meds ordered this  encounter  Medications  . phentermine (ADIPEX-P) 37.5 MG tablet    Sig: Take 1 tablet (37.5 mg total) by mouth daily before breakfast.    Dispense:  30 tablet    Refill:  0    Order Specific Question:   Supervising Provider    Answer:   Tania Ade H [[id number]]   Follow up prn   2. Body mass index 32.0-32.9, adult       I discussed the assessment and treatment plan with the patient. The patient was provided an opportunity to ask questions and all were answered. The patient agreed with the plan and demonstrated an understanding of the instructions.   The patient was advised to [place] back or seek an in-person evaluation/go to the ED if the  symptoms worsen or if the condition fails to improve as anticipated.  I provided 5 minutes of non-face-to-face time during this encounter. I was in my office at [facility].   Derrek Monaco, NP Center for [facility], [place]

## 2020-04-12 ENCOUNTER — Other Ambulatory Visit: Payer: Self-pay

## 2020-04-12 ENCOUNTER — Telehealth (INDEPENDENT_AMBULATORY_CARE_PROVIDER_SITE_OTHER): Payer: 59 | Admitting: Adult Health

## 2020-04-12 ENCOUNTER — Encounter: Payer: Self-pay | Admitting: Adult Health

## 2020-04-12 VITALS — BP 123/68 | HR 102 | Ht 62.0 in | Wt 186.6 lb

## 2020-04-12 DIAGNOSIS — Z713 Dietary counseling and surveillance: Secondary | ICD-10-CM

## 2020-04-12 DIAGNOSIS — Z6834 Body mass index (BMI) 34.0-34.9, adult: Secondary | ICD-10-CM

## 2020-04-12 MED ORDER — PHENTERMINE HCL 37.5 MG PO TABS: 37.5000 mg | ORAL_TABLET | Freq: Every day | ORAL | 0 refills | Status: DC

## 2020-04-12 NOTE — Progress Notes (Signed)
Patient ID: [name], female   DOB: 04/21/1980, 40 y.o.   MRN: [id number]   TELEHEALTH GYNECOLOGY VISIT ENCOUNTER NOTE  Provider location: Center for [facility].  I connected with [name] on 04/12/20 at  2:40 PM EST by telephone at home and verified that I am speaking with the correct person using two identifiers. Patient was unable to do MyChart audiovisual encounter due to technical difficulties, she tried several times.    I discussed the limitations, risks, security and privacy concerns of performing an evaluation and management service by telephone and the availability of in person appointments. I also discussed with the patient that there may be a patient responsible charge related to this service. The patient expressed understanding and agreed to proceed.   History:  [name] is a 40 y.o. [phone] female being evaluated today for weight and BP check, wants to get back on adipex, has been off since October, and can't lose by herself. She wonders about trying semaglutide  Injection. She denies any headaches, trouble sleeping or other concerns.       Past Medical History:  Diagnosis Date  . Body mass index 28.0-28.9, adult 07/14/2015  . Breast nodule 11/27/2013   Has firm mobile nodule at 11 0' clock 2 fb from areola and a tender nodule at 1 o'clock  5 fb in right breast  . Dysmenorrhea 03/16/2015  . Menorrhagia with regular cycle 03/16/2015  . Obesity 09/25/2014   Past Surgical History:  Procedure Laterality Date  . CARPAL TUNNEL RELEASE Right   . CHOLECYSTECTOMY    . DILITATION & CURRETTAGE/HYSTROSCOPY WITH NOVASURE ABLATION N/A 03/31/2015   Procedure: DILATATION & CURETTAGE/HYSTEROSCOPY WITH NOVASURE ABLATION; NOVASURE UTERINE CAVITY LENGTH 4.7 CM, WIDTH 3.7 CM, POWER 96 WATTS, TIME 2 MINUTES;  Surgeon: Tilda Burrow, MD;  Location: [facility];  Service: Gynecology;  Laterality: N/A;  . I&D left breast     mastitis  . MASS EXCISION N/A 08/03/2017    Procedure: EXCISION CYST ON BACK;  Surgeon: Lucretia Roers, MD;  Location: [facility];  Service: General;  Laterality: N/A;  . TUBAL LIGATION     The following portions of the patient's history were reviewed and updated as appropriate: allergies, current medications, past family history, past medical history, past social history, past surgical history and problem list.   Health Maintenance:  Normal pap and negative HRHPV on 06/11/17.    Review of Systems:  Pertinent items noted in HPI and remainder of comprehensive ROS otherwise negative.  Physical Exam:   General:  Alert, oriented and cooperative.   Mental Status: Normal mood and affect perceived. Normal judgment and thought content.  Physical exam deferred due to nature of the encounter BP 123/68 (BP Location: Left Arm, Patient Position: Sitting, Cuff Size: Normal)   Pulse (!) 102   Ht 5\' 2"  (1.575 m)   Wt 186 lb 9.6 oz (84.6 kg)   BMI 34.13 kg/m    Upstream - 04/12/20 1448      Pregnancy Intention Screening   Does the patient want to become pregnant in the next year? No    Does the patient's partner want to become pregnant in the next year? No    Would the patient like to discuss contraceptive options today? No      Contraception Wrap Up   Current Method Female Sterilization    End Method Female Sterilization    Contraception Counseling Provided No  Labs and Imaging No results found for this or any previous visit (from the past 336 hour(s)). No results found.    Assessment and Plan:     1. Body mass index 34.0-34.9, adult   2. Weight loss counseling, encounter for Increase water and exercise  Will Rx adipex Meds ordered this encounter  Medications  . phentermine (ADIPEX-P) 37.5 MG tablet    Sig: Take 1 tablet (37.5 mg total) by mouth daily before breakfast.    Dispense:  30 tablet    Refill:  0    Order Specific Question:   Supervising Provider    Answer:   Tania Ade H [[id number]]  Follow up in 4 weeks  for pap and physical and labs Will discuss semaglutide 2.4 mg Sub Q weekly injection then, depending on her BMI        I discussed the assessment and treatment plan with the patient. The patient was provided an opportunity to ask questions and all were answered. The patient agreed with the plan and demonstrated an understanding of the instructions.   The patient was advised to [place] back or seek an in-person evaluation/go to the ED if the symptoms worsen or if the condition fails to improve as anticipated.  I provided 6 minutes of non-face-to-face time during this encounter. I was in my office at [facility] tree during this encounter.   Derrek Monaco, NP Center for [facility], [place]

## 2020-04-13 ENCOUNTER — Telehealth: Payer: Self-pay | Admitting: Adult Health

## 2020-04-13 NOTE — Telephone Encounter (Signed)
[facility] - need to schedule a PAP/Physical with labs with Victorino Dike 4 weeks from 04/12/2020 - per checkout note from [place]

## 2020-04-13 NOTE — Telephone Encounter (Signed)
Pt called back, appt scheduled.

## 2020-05-12 ENCOUNTER — Other Ambulatory Visit (HOSPITAL_COMMUNITY)
Admission: RE | Admit: 2020-05-12 | Discharge: 2020-05-12 | Disposition: A | Discharge status: Home / Self Care | Payer: 59 | Source: Ambulatory Visit | Attending: Adult Health | Admitting: Adult Health

## 2020-05-12 ENCOUNTER — Ambulatory Visit (INDEPENDENT_AMBULATORY_CARE_PROVIDER_SITE_OTHER): Payer: 59 | Admitting: Adult Health

## 2020-05-12 ENCOUNTER — Other Ambulatory Visit: Payer: Self-pay

## 2020-05-12 ENCOUNTER — Encounter: Payer: Self-pay | Admitting: Adult Health

## 2020-05-12 VITALS — BP 131/82 | HR 90 | Ht 62.0 in | Wt 181.0 lb

## 2020-05-12 DIAGNOSIS — Z01419 Encounter for gynecological examination (general) (routine) without abnormal findings: Secondary | ICD-10-CM

## 2020-05-12 DIAGNOSIS — Z6833 Body mass index (BMI) 33.0-33.9, adult: Secondary | ICD-10-CM

## 2020-05-12 DIAGNOSIS — Z713 Dietary counseling and surveillance: Secondary | ICD-10-CM | POA: Diagnosis not present

## 2020-05-12 MED ORDER — PHENTERMINE HCL 37.5 MG PO TABS: 37.5000 mg | ORAL_TABLET | Freq: Every day | ORAL | 0 refills | Status: DC

## 2020-05-12 NOTE — Progress Notes (Signed)
Patient ID: [name], female   DOB: Feb 08, 1981, 40 y.o.   MRN: [id number] History of Present Illness: Eliyanah is a 40 year old white female,married, G4P4 in for a well woman gyn exam and pap. She has lost 5 lbs with phentermine.    Current Medications, Allergies, Past Medical History, Past Surgical History, Family History and Social History were reviewed in [employer] record.     Review of Systems: Patient denies any headaches, hearing loss, fatigue, blurred vision, shortness of breath, chest pain, abdominal pain, problems with bowel movements, urination, or intercourse. No joint pain or mood swings.    Physical Exam:BP 131/82 (BP Location: Left Arm, Patient Position: Sitting, Cuff Size: Normal)   Pulse 90   Ht 5\' 2"  (1.575 m)   Wt 181 lb (82.1 kg)   BMI 33.11 kg/m  General:  Well developed, well nourished, no acute distress Skin:  Warm and dry Neck:  Midline trachea, normal thyroid, good ROM, no lymphadenopathy Lungs; Clear to auscultation bilaterally Breast:  No dominant palpable mass, retraction, or nipple discharge Cardiovascular: Regular rate and rhythm Abdomen:  Soft, non tender, no hepatosplenomegaly Pelvic:  External genitalia is normal in appearance, no lesions.  The vagina is normal in appearance. Urethra has no lesions or masses. The cervix is bulbous.pap with HRHPV genotyping performed.  Uterus is felt to be normal size, shape, and contour.  No adnexal masses or tenderness noted.Bladder is non tender, no masses felt. Extremities/musculoskeletal:  No swelling or varicosities noted, no clubbing or cyanosis Psych:  No mood changes, alert and cooperative,seems happy AA is 0  Fall risk is low PHQ 9 score is 3, she is working a lot GAD 7 score is 2    Upstream - 05/12/20 1209      Pregnancy Intention Screening   Does the patient want to become pregnant in the next year? No    Does the patient's partner want to become pregnant in the next year? No     Would the patient like to discuss contraceptive options today? No      Contraception Wrap Up   Current Method Female Sterilization    End Method Female Sterilization    Contraception Counseling Provided No         Examination chaperoned by Levy Pupa LPN          Impression and Plan: 1. Encounter for gynecological examination with Papanicolaou smear of cervix Pap sent  Physical in 1 year Pap in 3 if normal Check CBC,CMP,TSH and lipids  2. Weight loss counseling, encounter for Will refill adipex Meds ordered this encounter  Medications  . phentermine (ADIPEX-P) 37.5 MG tablet    Sig: Take 1 tablet (37.5 mg total) by mouth daily before breakfast.    Dispense:  30 tablet    Refill:  0    Order Specific Question:   Supervising Provider    Answer:   Elonda Husky, LUTHER H [[id number]]  Continue with weight loss efforts   3. Body mass index 33.0-33.9, adult

## 2020-05-13 LAB — COMPREHENSIVE METABOLIC PANEL
ALT: 15 IU/L (ref 0–32)
AST: 14 IU/L (ref 0–40)
Albumin/Globulin Ratio: 1.7 (ref 1.2–2.2)
Albumin: 4.5 g/dL (ref 3.8–4.8)
Alkaline Phosphatase: 50 IU/L (ref 44–121)
BUN/Creatinine Ratio: 12 (ref 9–23)
BUN: 8 mg/dL (ref 6–20)
Bilirubin Total: 0.6 mg/dL (ref 0.0–1.2)
CO2: 23 mmol/L (ref 20–29)
Calcium: 9.6 mg/dL (ref 8.7–10.2)
Chloride: 103 mmol/L (ref 96–106)
Creatinine, Ser: 0.66 mg/dL (ref 0.57–1.00)
GFR calc Af Amer: 131 mL/min/{1.73_m2} (ref >59)
GFR calc non Af Amer: 113 mL/min/{1.73_m2} (ref >59)
Globulin, Total: 2.6 g/dL (ref 1.5–4.5)
Glucose: 91 mg/dL (ref 65–99)
Potassium: 4.6 mmol/L (ref 3.5–5.2)
Sodium: 144 mmol/L (ref 134–144)
Total Protein: 7.1 g/dL (ref 6.0–8.5)

## 2020-05-13 LAB — CBC
Hematocrit: 44.8 % (ref 34.0–46.6)
Hemoglobin: 15.4 g/dL (ref 11.1–15.9)
MCH: 30.7 pg (ref 26.6–33.0)
MCHC: 34.4 g/dL (ref 31.5–35.7)
MCV: 89 fL (ref 79–97)
Platelets: 308 10*3/uL (ref 150–450)
RBC: 5.02 x10E6/uL (ref 3.77–5.28)
RDW: 12.1 % (ref 11.7–15.4)
WBC: 8.2 10*3/uL (ref 3.4–10.8)

## 2020-05-13 LAB — LIPID PANEL
Chol/HDL Ratio: 3.8 ratio (ref 0.0–4.4)
Cholesterol, Total: 186 mg/dL (ref 100–199)
HDL: 49 mg/dL (ref >39)
LDL Chol Calc (NIH): 118 mg/dL — ABNORMAL HIGH (ref 0–99)
Triglycerides: 104 mg/dL (ref 0–149)
VLDL Cholesterol Cal: 19 mg/dL (ref 5–40)

## 2020-05-13 LAB — TSH: TSH: 1.72 u[IU]/mL (ref 0.450–4.500)

## 2020-05-17 LAB — CYTOLOGY - PAP
Comment: NEGATIVE
Diagnosis: NEGATIVE
High risk HPV: NEGATIVE

## 2020-06-09 ENCOUNTER — Other Ambulatory Visit: Payer: Self-pay

## 2020-06-09 ENCOUNTER — Telehealth (INDEPENDENT_AMBULATORY_CARE_PROVIDER_SITE_OTHER): Payer: 59 | Admitting: Adult Health

## 2020-06-09 ENCOUNTER — Encounter: Payer: Self-pay | Admitting: Adult Health

## 2020-06-09 VITALS — BP 129/74 | HR 90 | Ht 62.0 in | Wt 174.8 lb

## 2020-06-09 DIAGNOSIS — Z713 Dietary counseling and surveillance: Secondary | ICD-10-CM | POA: Diagnosis not present

## 2020-06-09 DIAGNOSIS — Z6831 Body mass index (BMI) 31.0-31.9, adult: Secondary | ICD-10-CM

## 2020-06-09 MED ORDER — PHENTERMINE HCL 37.5 MG PO TABS: 37.5000 mg | ORAL_TABLET | Freq: Every day | ORAL | 0 refills | Status: DC

## 2020-06-09 NOTE — Progress Notes (Signed)
Patient ID: [name], female   DOB: 1980/12/08, 40 y.o.   MRN: [id number]   TELEHEALTH GYNECOLOGY VISIT ENCOUNTER NOTE  Provider location: [facility].   I connected with Kandance Yano Dampier on 40/98/11 at 10:00 AM EST by telephone at home and verified that I am speaking with the correct person using two identifiers. Patient was unable to do MyChart audiovisual encounter due to technical difficulties, she tried several times.    I discussed the limitations, risks, security and privacy concerns of performing an evaluation and management service by telephone and the availability of in person appointments. I also discussed with the patient that there may be a patient responsible charge related to this service. The patient expressed understanding and agreed to proceed.   History:  [name] is a 40 y.o. [phone] female being evaluated today for weight and BP check and has lost 6.5 lbs and is doing Mediterranean diet. She denies any headaches or trouble sleeping or other concerns.       Past Medical History:  Diagnosis Date  . Body mass index 28.0-28.9, adult 07/14/2015  . Breast nodule 11/27/2013   Has firm mobile nodule at 11 0' clock 2 fb from areola and a tender nodule at 1 o'clock  5 fb in right breast  . Dysmenorrhea 03/16/2015  . Menorrhagia with regular cycle 03/16/2015  . Obesity 09/25/2014   Past Surgical History:  Procedure Laterality Date  . CARPAL TUNNEL RELEASE Right   . CHOLECYSTECTOMY    . DILITATION & CURRETTAGE/HYSTROSCOPY WITH NOVASURE ABLATION N/A 03/31/2015   Procedure: DILATATION & CURETTAGE/HYSTEROSCOPY WITH NOVASURE ABLATION; NOVASURE UTERINE CAVITY LENGTH 4.7 CM, WIDTH 3.7 CM, POWER 96 WATTS, TIME 2 MINUTES;  Surgeon: [name], MD;  Location: [facility];  Service: Gynecology;  Laterality: N/A;  . I&D left breast     mastitis  . MASS EXCISION N/A 08/03/2017   Procedure: EXCISION CYST ON BACK;  Surgeon: Virl Cagey, MD;  Location:  [facility];  Service: General;  Laterality: N/A;  . TUBAL LIGATION     The following portions of the patient's history were reviewed and updated as appropriate: allergies, current medications, past family history, past medical history, past social history, past surgical history and problem list.   Health Maintenance:  Normal pap and negative HRHPV on 05/12/20  Review of Systems:  Pertinent items noted in HPI and remainder of comprehensive ROS otherwise negative.  Physical Exam:   General:  Alert, oriented and cooperative.   Mental Status: Normal mood and affect perceived. Normal judgment and thought content.  Physical exam deferred due to nature of the encounter BP 129/74 (BP Location: Left Arm)   Pulse 90   Ht 5\' 2"  (1.575 m)   Wt 174 lb 12.8 oz (79.3 kg)   BMI 31.97 kg/m    Upstream - 06/09/20 1027      Pregnancy Intention Screening   Does the patient want to become pregnant in the next year? No    Does the patient's partner want to become pregnant in the next year? No    Would the patient like to discuss contraceptive options today? No      Contraception Wrap Up   Current Method Female Sterilization    End Method Female Sterilization    Contraception Counseling Provided No          Labs and Imaging No results found for this or any previous visit (from the past 336 hour(s)). No  results found.    Assessment and Plan:     1. Weight loss counseling, encounter for Will continue adipex Meds ordered this encounter  Medications  . phentermine (ADIPEX-P) 37.5 MG tablet    Sig: Take 1 tablet (37.5 mg total) by mouth daily before breakfast.    Dispense:  30 tablet    Refill:  0    Order Specific Question:   Supervising Provider    Answer:   Florian Buff [[id number]]   Praised over weight loss  Follow up in 4 weeks with tele visit   2. Body mass index 31.0-31.9, adult        I discussed the assessment and treatment plan with the patient. The patient was provided an  opportunity to ask questions and all were answered. The patient agreed with the plan and demonstrated an understanding of the instructions.   The patient was advised to [place] back or seek an in-person evaluation/go to the ED if the symptoms worsen or if the condition fails to improve as anticipated.  I provided 5 minutes of non-face-to-face time during this encounter. I was in my office at [facility] tree during this encounter.  Derrek Monaco, NP Center for [employer], [place]

## 2020-07-07 ENCOUNTER — Other Ambulatory Visit: Payer: Self-pay

## 2020-07-07 ENCOUNTER — Encounter: Payer: Self-pay | Admitting: Adult Health

## 2020-07-07 ENCOUNTER — Telehealth (INDEPENDENT_AMBULATORY_CARE_PROVIDER_SITE_OTHER): Payer: 59 | Admitting: Adult Health

## 2020-07-07 VITALS — BP 134/89 | HR 100 | Ht 62.0 in | Wt 172.8 lb

## 2020-07-07 DIAGNOSIS — Z713 Dietary counseling and surveillance: Secondary | ICD-10-CM

## 2020-07-07 DIAGNOSIS — Z6831 Body mass index (BMI) 31.0-31.9, adult: Secondary | ICD-10-CM | POA: Diagnosis not present

## 2020-07-07 MED ORDER — PHENTERMINE HCL 37.5 MG PO TABS: 37.5000 mg | ORAL_TABLET | Freq: Every day | ORAL | 0 refills | Status: DC

## 2020-07-07 NOTE — Progress Notes (Signed)
Patient ID: [name], female   DOB: [birth year], 40 y.o.   MRN: [id number]   TELEHEALTH GYNECOLOGY VISIT ENCOUNTER NOTE  Provider location: [facility] at [facility].  Patient location: Work  I connected with Ellison Leisure Petraitis on 11/22/46 at 11:10 AM EDT by telephone and verified that I am speaking with the correct person using two identifiers. Patient was unable to do MyChart audiovisual encounter due to technical difficulties, she was at work   I discussed the limitations, risks, security and privacy concerns of performing an evaluation and management service by telephone and the availability of in person appointments. I also discussed with the patient that there may be a patient responsible charge related to this service. The patient expressed understanding and agreed to proceed.   History:  [name] is a 40 y.o. [phone] female being evaluated today for follow up on taking phentermine and has lost 2 more lbs for total of 14 lbs. She denies any headaches, trouble sleeping or other concerns.       Past Medical History:  Diagnosis Date  . Body mass index 28.0-28.9, adult 07/14/2015  . Breast nodule 11/27/2013   Has firm mobile nodule at 11 0' clock 2 fb from areola and a tender nodule at 1 o'clock  5 fb in right breast  . Dysmenorrhea 03/16/2015  . Menorrhagia with regular cycle 03/16/2015  . Obesity 09/25/2014   Past Surgical History:  Procedure Laterality Date  . CARPAL TUNNEL RELEASE Right   . CHOLECYSTECTOMY    . DILITATION & CURRETTAGE/HYSTROSCOPY WITH NOVASURE ABLATION N/A 03/31/2015   Procedure: DILATATION & CURETTAGE/HYSTEROSCOPY WITH NOVASURE ABLATION; NOVASURE UTERINE CAVITY LENGTH 4.7 CM, WIDTH 3.7 CM, POWER 96 WATTS, TIME 2 MINUTES;  Surgeon: [name], MD;  Location: [facility];  Service: Gynecology;  Laterality: N/A;  . I&D left breast     mastitis  . MASS EXCISION N/A 08/03/2017   Procedure: EXCISION CYST ON BACK;  Surgeon: Virl Cagey, MD;   Location: [facility];  Service: General;  Laterality: N/A;  . TUBAL LIGATION     The following portions of the patient's history were reviewed and updated as appropriate: allergies, current medications, past family history, past medical history, past social history, past surgical history and problem list.   Health Maintenance:  Normal pap and negative HRHPV on 05/12/2020.  Review of Systems:  Pertinent items noted in HPI and remainder of comprehensive ROS otherwise negative.  Physical Exam:   General:  Alert, oriented and cooperative.   Mental Status: Normal mood and affect perceived. Normal judgment and thought content.  Physical exam deferred due to nature of the encounter BP 134/89 (BP Location: Left Arm, Patient Position: Sitting, Cuff Size: Normal)   Pulse 100   Ht 5\' 2"  (1.575 m)   Wt 172 lb 12.8 oz (78.4 kg)   BMI 31.61 kg/m    Upstream - 07/07/20 1106      Pregnancy Intention Screening   Does the patient want to become pregnant in the next year? No    Does the patient's partner want to become pregnant in the next year? No    Would the patient like to discuss contraceptive options today? No      Contraception Wrap Up   Current Method Female Sterilization    End Method Female Sterilization    Contraception Counseling Provided No          Labs and Imaging No results found for this or any previous  visit (from the past 336 hour(s)). No results found.    Assessment and Plan:         1. Weight loss counseling, encounter for Will refill phentermine this month and then she is taking a break to see how she does on her own Meds ordered this encounter  Medications  . phentermine (ADIPEX-P) 37.5 MG tablet    Sig: Take 1 tablet (37.5 mg total) by mouth daily before breakfast.    Dispense:  30 tablet    Refill:  0    Order Specific Question:   Supervising Provider    Answer:   Elonda Husky, LUTHER H [[id number]]    2. Body mass index 31.0-31.9, adult       I discussed the assessment and  treatment plan with the patient. The patient was provided an opportunity to ask questions and all were answered. The patient agreed with the plan and demonstrated an understanding of the instructions.   The patient was advised to [place] back or seek an in-person evaluation/go to the ED if the symptoms worsen or if the condition fails to improve as anticipated.  I provided 5 minutes of non-face-to-face time during this encounter. I was in my office at [facility] during this encounter.  Derrek Monaco, NP Center for [employer], [place]

## 2020-09-20 ENCOUNTER — Other Ambulatory Visit: Payer: Self-pay | Admitting: Adult Health

## 2020-09-29 ENCOUNTER — Other Ambulatory Visit: Payer: Self-pay

## 2020-09-29 ENCOUNTER — Telehealth: Payer: Self-pay | Admitting: Adult Health

## 2020-09-29 MED ORDER — PHENTERMINE HCL 37.5 MG PO TABS: 37.5000 mg | ORAL_TABLET | Freq: Every day | ORAL | 0 refills | Status: DC

## 2020-09-29 NOTE — Telephone Encounter (Signed)
She wants to get on adipex, will send rx in and follow up in 4 weeks

## 2021-06-20 ENCOUNTER — Other Ambulatory Visit: Payer: 59 | Admitting: Adult Health

## 2021-06-21 ENCOUNTER — Ambulatory Visit (INDEPENDENT_AMBULATORY_CARE_PROVIDER_SITE_OTHER): Payer: 59 | Admitting: Adult Health

## 2021-06-21 ENCOUNTER — Other Ambulatory Visit: Payer: Self-pay

## 2021-06-21 ENCOUNTER — Encounter: Payer: Self-pay | Admitting: Adult Health

## 2021-06-21 VITALS — BP 119/75 | HR 88 | Ht 62.25 in | Wt 188.0 lb

## 2021-06-21 DIAGNOSIS — R5383 Other fatigue: Secondary | ICD-10-CM

## 2021-06-21 DIAGNOSIS — F419 Anxiety disorder, unspecified: Secondary | ICD-10-CM | POA: Diagnosis not present

## 2021-06-21 DIAGNOSIS — Z0000 Encounter for general adult medical examination without abnormal findings: Secondary | ICD-10-CM | POA: Diagnosis not present

## 2021-06-21 DIAGNOSIS — Z01419 Encounter for gynecological examination (general) (routine) without abnormal findings: Secondary | ICD-10-CM

## 2021-06-21 MED ORDER — SERTRALINE HCL 25 MG PO TABS: 25.0000 mg | ORAL_TABLET | Freq: Every day | ORAL | 3 refills | Status: DC

## 2021-06-21 NOTE — Progress Notes (Signed)
Patient ID: [name], female   DOB: 11-Jun-1980, 41 y.o.   MRN: [id number] ?History of Present Illness: ?[name] is a 41 year old white female, married, G4P4 in for a well woman gyn exam. She is tired, has gained some weight, and had labs in [place], low B12, and vitamin D and testosterone and +ANA. Hands swollen in mornings. ?She had ablation, still spots and cramps some. ?She is having issues with daughter, taking her to [facility] today, stressed  ?Lab Results  ?Component Value Date  ? DIAGPAP  05/12/2020  ?  - Negative for intraepithelial lesion or malignancy (NILM)  ? HPV NOT DETECTED 06/11/2017  ? [place] Negative 05/12/2020  ?  ? ? ?Current Medications, Allergies, Past Medical History, Past Surgical History, Family History and Social History were reviewed in [employer] record.   ? ? ?Review of Systems: ?Patient denies any headaches, hearing loss,  blurred vision, shortness of breath, chest pain, abdominal pain, problems with bowel movements, urination, or intercourse. No joint pain or mood swings.  ?See HPI for positives  ? ? ?Physical Exam:BP 119/75 (BP Location: Left Arm, Patient Position: Sitting, Cuff Size: Normal)   Pulse 88   Ht 5' 2.25" (1.581 m)   Wt 188 lb (85.3 kg)   BMI 34.11 kg/m?   ?General:  Well developed, well nourished, no acute distress ?Skin:  Warm and dry ?Neck:  Midline trachea, normal thyroid, good ROM, no lymphadenopathy ?Lungs; Clear to auscultation bilaterally ?Breast:  No dominant palpable mass, retraction, or nipple discharge ?Cardiovascular: Regular rate and rhythm ?Abdomen:  Soft, non tender, no hepatosplenomegaly ?Pelvic:  External genitalia is normal in appearance, no lesions.  The vagina is normal in appearance. Urethra has no lesions or masses. The cervix is bulbous.  Uterus is felt to be normal size, shape, and contour.  No adnexal masses or tenderness noted.Bladder is non tender, no masses felt. ?Rectal is deferred. ?Extremities/musculoskeletal:  No  swelling or varicosities noted, no clubbing or cyanosis ?Psych:  No mood changes, alert and cooperative,seems happy  ?Reviewed labs, will scan to chart. ?AA is 0 ?Fall risk is low ?Depression screen [facility] 2/9 06/21/2021 05/12/2020 03/18/2019  ?Decreased Interest 1 0 0  ?Down, Depressed, Hopeless 1 0 0  ?PHQ - 2 Score 2 0 0  ?Altered sleeping 0 0 -  ?Tired, decreased energy 3 3 -  ?Change in appetite 1 0 -  ?Feeling bad or failure about yourself  0 0 -  ?Trouble concentrating 0 0 -  ?Moving slowly or fidgety/restless 0 0 -  ?Suicidal thoughts 0 0 -  ?PHQ-9 Score 6 3 -  ?  ?GAD 7 : Generalized Anxiety Score 06/21/2021 05/12/2020  ?Nervous, Anxious, on Edge 2 0  ?Control/stop worrying 3 1  ?Worry too much - different things 3 0  ?Trouble relaxing 0 0  ?Restless 0 0  ?Easily annoyed or irritable 1 1  ?Afraid - awful might happen 0 0  ?Total GAD 7 Score 9 2  ? ?  ? Upstream - 06/21/21 1033   ? ?  ? Pregnancy Intention Screening  ? Does the patient want to become pregnant in the next year? No   ? Does the patient's partner want to become pregnant in the next year? No   ? Would the patient like to discuss contraceptive options today? No   ?  ? Contraception Wrap Up  ? Current Method Female Sterilization   ? End Method Female Sterilization   ? Contraception Counseling  Provided No   ? ?  ?  ? ?  ? Examination chaperoned by Levy Pupa LPN ? ?Impression and Plan: ?1. Encounter for well woman exam with routine gynecological exam ?Physical in 1 year ? Pap in 2025 ? ?2. Anxiety ?Will try zoloft to see if helps, will start low ?Meds ordered this encounter  ?Medications  ? sertraline (ZOLOFT) 25 MG tablet  ?  Sig: Take 1 tablet (25 mg total) by mouth daily.  ?  Dispense:  30 tablet  ?  Refill:  3  ?  Order Specific Question:   Supervising Provider  ?  Answer:   Tania Ade H [[id number]]  ? Message me in about 6-8 weeks for up date  ? ?3. Tired ?Taking B12 and vitamin D  ? ? ? ?  ?  ?

## 2021-07-18 ENCOUNTER — Telehealth: Payer: Self-pay | Admitting: Adult Health

## 2021-07-18 MED ORDER — PHENTERMINE HCL 37.5 MG PO TABS
37.5000 mg | ORAL_TABLET | Freq: Every day | ORAL | 0 refills | Status: DC
Start: 2021-07-18 — End: 2021-08-19

## 2021-07-18 NOTE — Telephone Encounter (Signed)
[name] wants to try phentermine again, had physical 06/21/21 weight was 188 lbs and BP was 119/75, will rx phentermine  ?

## 2021-08-11 ENCOUNTER — Ambulatory Visit: Payer: 59 | Admitting: Adult Health

## 2021-08-19 ENCOUNTER — Ambulatory Visit: Payer: 59 | Admitting: Adult Health

## 2021-08-19 ENCOUNTER — Encounter: Payer: Self-pay | Admitting: Adult Health

## 2021-08-19 VITALS — BP 117/68 | HR 92 | Ht 62.25 in | Wt 180.4 lb

## 2021-08-19 DIAGNOSIS — F419 Anxiety disorder, unspecified: Secondary | ICD-10-CM | POA: Diagnosis not present

## 2021-08-19 DIAGNOSIS — Z6832 Body mass index (BMI) 32.0-32.9, adult: Secondary | ICD-10-CM | POA: Diagnosis not present

## 2021-08-19 DIAGNOSIS — Z713 Dietary counseling and surveillance: Secondary | ICD-10-CM | POA: Diagnosis not present

## 2021-08-19 MED ORDER — SERTRALINE HCL 50 MG PO TABS: 50.0000 mg | ORAL_TABLET | Freq: Every day | ORAL | 6 refills | Status: DC

## 2021-08-19 MED ORDER — PHENTERMINE HCL 37.5 MG PO TABS: 37.5000 mg | ORAL_TABLET | Freq: Every day | ORAL | 0 refills | Status: DC

## 2021-08-19 NOTE — Progress Notes (Signed)
Patient ID: [name], female   DOB: [birth year], 41 y.o.   MRN: [id number] ? ? ?TELEHEALTH GYNECOLOGY VISIT ENCOUNTER NOTE ? ?Provider location: Center for [employer] at [facility]  ? ?Patient location: work ? ?I connected with Jeimy Bickert Burrowes on 40/98/11 at 10:10 AM EDT by telephone and verified that I am speaking with the correct person using two identifiers. Patient was unable to do MyChart audiovisual encounter due to technical difficulties, she tried several times.  ?  ?I discussed the limitations, risks, security and privacy concerns of performing an evaluation and management service by telephone and the availability of in person appointments. I also discussed with the patient that there may be a patient responsible charge related to this service. The patient expressed understanding and agreed to proceed. ?  ?History:  ?[name] is a 41 y.o. [phone] female being evaluated today for weight and BP check, since starting phentermine and follow up on zoloft 25 mg and is doing better but wants to try 50 mg, and has lost weight. She denies any trouble sleeping or headaches or other concerns.   ?  ?  ?Past Medical History:  ?Diagnosis Date  ? Body mass index 28.0-28.9, adult 07/14/2015  ? Breast nodule 11/27/2013  ? Has firm mobile nodule at 11 0' clock 2 fb from areola and a tender nodule at 1 o'clock  5 fb in right breast  ? Dysmenorrhea 03/16/2015  ? Insulin resistance   ? Menorrhagia with regular cycle 03/16/2015  ? Obesity 09/25/2014  ? ?Past Surgical History:  ?Procedure Laterality Date  ? CARPAL TUNNEL RELEASE Right   ? CHOLECYSTECTOMY    ? DILITATION & CURRETTAGE/HYSTROSCOPY WITH NOVASURE ABLATION N/A 03/31/2015  ? Procedure: DILATATION & CURETTAGE/HYSTEROSCOPY WITH NOVASURE ABLATION; NOVASURE UTERINE CAVITY LENGTH 4.7 CM, WIDTH 3.7 CM, POWER 96 WATTS, TIME 2 MINUTES;  Surgeon: [name], MD;  Location: [facility];  Service: Gynecology;  Laterality: N/A;  ? I&D left breast    ? mastitis  ? MASS  EXCISION N/A 08/03/2017  ? Procedure: EXCISION CYST ON BACK;  Surgeon: Virl Cagey, MD;  Location: [facility];  Service: General;  Laterality: N/A;  ? TUBAL LIGATION    ? ?The following portions of the patient's history were reviewed and updated as appropriate: allergies, current medications, past family history, past medical history, past social history, past surgical history and problem list.  ? ?Health Maintenance:  Normal pap and negative HRHPV  ?Lab Results  ?Component Value Date  ? DIAGPAP  05/12/2020  ?  - Negative for intraepithelial lesion or malignancy (NILM)  ? HPV NOT DETECTED 06/11/2017  ? [place] Negative 05/12/2020  ?  ? ?Review of Systems:  ?Pertinent items noted in HPI and remainder of comprehensive ROS otherwise negative. ? ?Physical Exam:  ? ?General:  Alert, oriented and cooperative.   ?Mental Status: Normal mood and affect perceived. Normal judgment and thought content.  ?Physical exam deferred due to nature of the encounter ?BP 117/68 (BP Location: Left Arm, Patient Position: Sitting, Cuff Size: Normal)   Pulse 92   Ht 5' 2.25" (1.581 m)   Wt 180 lb 6.4 oz (81.8 kg)   BMI 32.73 kg/m?   has lost about 7 1/2/ lbs  ? ?  08/19/2021  ? 10:50 AM 06/21/2021  ? 10:35 AM 05/12/2020  ? 11:38 AM  ?Depression screen PHQ 2/9  ?Decreased Interest 0 1 0  ?Down, Depressed, Hopeless 0 1 0  ?PHQ - 2 Score 0  2 0  ?Altered sleeping  0 0  ?Tired, decreased energy  3 3  ?Change in appetite  1 0  ?Feeling bad or failure about yourself   0 0  ?Trouble concentrating  0 0  ?Moving slowly or fidgety/restless  0 0  ?Suicidal thoughts  0 0  ?PHQ-9 Score  6 3  ?  ? Upstream - 08/19/21 0839   ? ?  ? Pregnancy Intention Screening  ? Does the patient want to become pregnant in the next year? --   ? Does the patient's partner want to become pregnant in the next year? --   ? Would the patient like to discuss contraceptive options today? --   ?  ? Contraception Wrap Up  ? End Method Female Sterilization   ? Contraception  Counseling Provided No   ? ?  ?  ? ?  ?  ?Labs and Imaging ?No results found for this or any previous visit (from the past 336 hour(s)). ?No results found.    ?Assessment and Plan:  ?   ? 1. Weight loss counseling, encounter for ?Will continue phentermine 37.5 mg 1 daily ?Meds ordered this encounter  ?Medications  ? sertraline (ZOLOFT) 50 MG tablet  ?  Sig: Take 1 tablet (50 mg total) by mouth daily.  ?  Dispense:  30 tablet  ?  Refill:  6  ?  Order Specific Question:   Supervising Provider  ?  Answer:   Tania Ade H [[id number]]  ? phentermine (ADIPEX-P) 37.5 MG tablet  ?  Sig: Take 1 tablet (37.5 mg total) by mouth daily before breakfast. Take 1 daily  ?  Dispense:  30 tablet  ?  Refill:  0  ?  Order Specific Question:   Supervising Provider  ?  Answer:   Tania Ade H [[id number]]  ?  ?Follow up in 4 weeks can be tele visit  ?2. Anxiety ?Will increase Zoloft to 50 mg 1 daily  ? ?3. Body mass index 32.0-32.9, adult ? ?   ?  ?I discussed the assessment and treatment plan with the patient. The patient was provided an opportunity to ask questions and all were answered. The patient agreed with the plan and demonstrated an understanding of the instructions. ?  ?The patient was advised to [place] back or seek an in-person evaluation/go to the ED if the symptoms worsen or if the condition fails to improve as anticipated. ? ?I provided 10  minutes of non-face-to-face time during this encounter. ?I was in my office [facility] during this encounter  ? ?Derrek Monaco, NP ?Center for [place]  ?

## 2021-09-20 ENCOUNTER — Encounter: Payer: Self-pay | Admitting: Adult Health

## 2021-09-20 ENCOUNTER — Ambulatory Visit: Payer: 59 | Admitting: Adult Health

## 2021-09-20 VITALS — BP 117/68 | HR 90 | Ht 61.0 in | Wt 178.6 lb

## 2021-09-20 DIAGNOSIS — Z6833 Body mass index (BMI) 33.0-33.9, adult: Secondary | ICD-10-CM | POA: Diagnosis not present

## 2021-09-20 DIAGNOSIS — Z713 Dietary counseling and surveillance: Secondary | ICD-10-CM | POA: Diagnosis not present

## 2021-09-20 MED ORDER — PHENTERMINE HCL 37.5 MG PO TABS: 37.5000 mg | ORAL_TABLET | Freq: Every day | ORAL | 0 refills | Status: DC

## 2021-09-20 NOTE — Progress Notes (Signed)
Patient ID: [name], female   DOB: March 07, 1981, 41 y.o.   MRN: [id number]   [place] VISIT ENCOUNTER NOTE  Provider location: Center for [place] at [facility]   Patient location: work  I connected with [name] on 65/78/46 at 10:50 AM EDT by telephone and verified that I am speaking with the correct person using two identifiers. Patient was unable to do MyChart audiovisual encounter due to technical difficulties, she tried several times.    I discussed the limitations, risks, security and privacy concerns of performing an evaluation and management service by telephone and the availability of in person appointments. I also discussed with the patient that there may be a patient responsible charge related to this service. The patient expressed understanding and agreed to proceed.   History:  [name] is a 41 y.o. [phone] female being evaluated today for weight and BP check has been on phentermine and has had hard month, but did lose 2 lbs. Down about 10 lbs total. She denies any headaches, or trouble sleeping or other concerns.       Past Medical History:  Diagnosis Date   Body mass index 28.0-28.9, adult 07/14/2015   Breast nodule 11/27/2013   Has firm mobile nodule at 11 0' clock 2 fb from areola and a tender nodule at 1 o'clock  5 fb in right breast   Dysmenorrhea 03/16/2015   Insulin resistance    Menorrhagia with regular cycle 03/16/2015   Obesity 09/25/2014   Past Surgical History:  Procedure Laterality Date   CARPAL TUNNEL RELEASE Right    CHOLECYSTECTOMY     DILITATION & CURRETTAGE/HYSTROSCOPY WITH NOVASURE ABLATION N/A 03/31/2015   Procedure: DILATATION & CURETTAGE/HYSTEROSCOPY WITH NOVASURE ABLATION; NOVASURE UTERINE CAVITY LENGTH 4.7 CM, WIDTH 3.7 CM, POWER 96 WATTS, TIME 2 MINUTES;  Surgeon: [name], MD;  Location: [facility];  Service: Gynecology;  Laterality: N/A;   I&D left breast     mastitis   MASS EXCISION N/A 08/03/2017    Procedure: EXCISION CYST ON BACK;  Surgeon: Virl Cagey, MD;  Location: [facility];  Service: General;  Laterality: N/A;   TUBAL LIGATION     The following portions of the patient's history were reviewed and updated as appropriate: allergies, current medications, past family history, past medical history, past social history, past surgical history and problem list.   Health Maintenance:  Lab Results  Component Value Date   DIAGPAP  05/12/2020    - Negative for intraepithelial lesion or malignancy (NILM)   HPV NOT DETECTED 06/11/2017   [name] Negative 05/12/2020     Review of Systems:  Pertinent items noted in HPI and remainder of comprehensive ROS otherwise negative.  Physical Exam:   General:  Alert, oriented and cooperative.   Mental Status: Normal mood and affect perceived. Normal judgment and thought content.  Physical exam deferred due to nature of the encounter BP 117/68 (BP Location: Left Arm, Patient Position: Sitting, Cuff Size: Normal)   Pulse 90   Ht '5\' 1"'$  (1.549 m)   Wt 178 lb 9.6 oz (81 kg)   BMI 33.75 kg/m    Upstream - 09/20/21 1121       Pregnancy Intention Screening   Does the patient want to become pregnant in the next year? No    Does the patient's partner want to become pregnant in the next year? No    Would the patient like to discuss contraceptive options today? No  Contraception Wrap Up   Current Method Female Sterilization    End Method Female Sterilization             Labs and Imaging No results found for this or any previous visit (from the past 336 hour(s)). No results found.    Assessment and Plan:     1. Weight loss counseling, encounter for Will refill phentermine  Meds ordered this encounter  Medications   phentermine (ADIPEX-P) 37.5 MG tablet    Sig: Take 1 tablet (37.5 mg total) by mouth daily before breakfast. Take 1 daily    Dispense:  30 tablet    Refill:  0    Order Specific Question:   Supervising Provider     Answer:   Tania Ade H [[id number]]    Follow up in 4 weeks       I discussed the assessment and treatment plan with the patient. The patient was provided an opportunity to ask questions and all were answered. The patient agreed with the plan and demonstrated an understanding of the instructions.   The patient was advised to [place] back or seek an in-person evaluation/go to the ED if the symptoms worsen or if the condition fails to improve as anticipated.  I provided 10 minutes of non-face-to-face time during this encounter. I was in my office at [facility] during this encounter   Derrek Monaco, [place] for [employer], [place]

## 2022-01-05 ENCOUNTER — Telehealth: Payer: Commercial Managed Care - PPO | Admitting: Adult Health

## 2022-01-06 ENCOUNTER — Telehealth: Payer: 59 | Admitting: Adult Health

## 2023-02-06 ENCOUNTER — Ambulatory Visit (INDEPENDENT_AMBULATORY_CARE_PROVIDER_SITE_OTHER): Payer: Self-pay | Admitting: Adult Health

## 2023-02-06 ENCOUNTER — Encounter: Payer: Self-pay | Admitting: Adult Health

## 2023-02-06 VITALS — BP 126/73 | HR 91 | Ht 62.0 in | Wt 198.5 lb

## 2023-02-06 DIAGNOSIS — Z713 Dietary counseling and surveillance: Secondary | ICD-10-CM

## 2023-02-06 DIAGNOSIS — R4589 Other symptoms and signs involving emotional state: Secondary | ICD-10-CM

## 2023-02-06 DIAGNOSIS — Z6836 Body mass index (BMI) 36.0-36.9, adult: Secondary | ICD-10-CM

## 2023-02-06 DIAGNOSIS — F419 Anxiety disorder, unspecified: Secondary | ICD-10-CM

## 2023-02-06 DIAGNOSIS — R232 Flushing: Secondary | ICD-10-CM

## 2023-02-06 MED ORDER — SERTRALINE HCL 50 MG PO TABS: 50.0000 mg | ORAL_TABLET | Freq: Every day | ORAL | 6 refills | Status: DC

## 2023-02-06 MED ORDER — PHENTERMINE HCL 37.5 MG PO TABS: 37.5000 mg | ORAL_TABLET | Freq: Every day | ORAL | 0 refills | Status: DC

## 2023-02-06 NOTE — Progress Notes (Signed)
Patient ID: [name], female   DOB: 1980/09/20, 42 y.o.   MRN: [id number]   TELEHEALTH GYNECOLOGY VISIT ENCOUNTER NOTE  Provider location: [facility]   Patient location: Home  I connected with [name] on 02/06/23 at  3:10 PM EDT by telephone and verified that I am speaking with the correct person using two identifiers. Patient was unable to do MyChart audiovisual encounter due to technical difficulties, she tried several times.    I discussed the limitations, risks, security and privacy concerns of performing an evaluation and management service by telephone and the availability of in person appointments. I also discussed with the patient that there may be a patient responsible charge related to this service. The patient expressed understanding and agreed to proceed.   History:  [name] is a 42 y.o. [phone] female being evaluated today for weight loss management, she can't lose, feels moody and has some hot flashes, she needs refill on Zoloft too.. She denies any other concerns.       Past Medical History:  Diagnosis Date   Body mass index 28.0-28.9, adult 07/14/2015   Breast nodule 11/27/2013   Has firm mobile nodule at 11 0' clock 2 fb from areola and a tender nodule at 1 o'clock  5 fb in right breast   Dysmenorrhea 03/16/2015   Insulin resistance    Menorrhagia with regular cycle 03/16/2015   Obesity 09/25/2014   Past Surgical History:  Procedure Laterality Date   CARPAL TUNNEL RELEASE Right    CHOLECYSTECTOMY     DILITATION & CURRETTAGE/HYSTROSCOPY WITH NOVASURE ABLATION N/A 03/31/2015   Procedure: DILATATION & CURETTAGE/HYSTEROSCOPY WITH NOVASURE ABLATION; NOVASURE UTERINE CAVITY LENGTH 4.7 CM, WIDTH 3.7 CM, POWER 96 WATTS, TIME 2 MINUTES;  Surgeon: Tilda Burrow, MD;  Location: [facility];  Service: Gynecology;  Laterality: N/A;   I&D left breast     mastitis   MASS EXCISION N/A 08/03/2017   Procedure: EXCISION CYST ON BACK;   Surgeon: Lucretia Roers, MD;  Location: [facility];  Service: General;  Laterality: N/A;   TUBAL LIGATION     The following portions of the patient's history were reviewed and updated as appropriate: allergies, current medications, past family history, past medical history, past social history, past surgical history and problem list.   Health Maintenance:     Component Value Date/Time   DIAGPAP  05/12/2020 1142    - Negative for intraepithelial lesion or malignancy (NILM)   DIAGPAP  06/11/2017 0000    NEGATIVE FOR INTRAEPITHELIAL LESIONS OR MALIGNANCY.   HPVHIGH Negative 05/12/2020 1142   ADEQPAP  05/12/2020 1142    Satisfactory for evaluation; transformation zone component PRESENT.   ADEQPAP  06/11/2017 0000    Satisfactory for evaluation  endocervical/transformation zone component PRESENT.     Review of Systems:  Pertinent items noted in HPI and remainder of comprehensive ROS otherwise negative.  Physical Exam:   General:  Alert, oriented and cooperative.   Mental Status: Normal mood and affect perceived. Normal judgment and thought content.  Physical exam deferred due to nature of the encounter BP 126/73 (BP Location: Left Arm, Patient Position: Sitting, Cuff Size: Normal)   Pulse 91   Ht 5\' 2"  (1.575 m)   Wt 198 lb 8 oz (90 kg)   BMI 36.31 kg/m   Fall risk is low  Upstream - 02/06/23 1531       Pregnancy Intention Screening   Does the patient want  to become pregnant in the next year? N/A    Does the patient's partner want to become pregnant in the next year? N/A    Would the patient like to discuss contraceptive options today? N/A      Contraception Wrap Up   Current Method Female Sterilization    End Method Female Sterilization    Contraception Counseling Provided No             Labs and Imaging No results found for this or any previous visit (from the past 336 hour(s)). No results found.    Assessment and Plan:     1. Weight loss counseling, encounter  for Discussed more water Decrease portions Try walking more Will rx phentermine 37.5 mg 1 daily Meds ordered this encounter  Medications   sertraline (ZOLOFT) 50 MG tablet    Sig: Take 1 tablet (50 mg total) by mouth daily.    Dispense:  30 tablet    Refill:  6    Order Specific Question:   Supervising Provider    Answer:   Duane Lope H [[id number]]   phentermine (ADIPEX-P) 37.5 MG tablet    Sig: Take 1 tablet (37.5 mg total) by mouth daily before breakfast. Take 1 daily    Dispense:  30 tablet    Refill:  0    Order Specific Question:   Supervising Provider    Answer:   Duane Lope H [[id number]]    Send me weight and BP in 4 weeks Return in 10 weeks for pap and physical when has insurance   2. Body mass index 36.0-36.9, adult  3. Anxiety Refilled zoloft  4. Moody  5. Hot flashes       I discussed the assessment and treatment plan with the patient. The patient was provided an opportunity to ask questions and all were answered. The patient agreed with the plan and demonstrated an understanding of the instructions.   The patient was advised to [place] back or seek an in-person evaluation/go to the ED if the symptoms worsen or if the condition fails to improve as anticipated.  I provided 10 minutes of non-face-to-face time during this encounter.   Cyril Mourning, NP Center for [employer], [facility]

## 2023-03-06 ENCOUNTER — Telehealth: Payer: Self-pay | Admitting: Adult Health

## 2023-03-06 MED ORDER — PHENTERMINE HCL 37.5 MG PO TABS: 37.5000 mg | ORAL_TABLET | Freq: Every day | ORAL | 0 refills | Status: DC

## 2023-03-06 NOTE — Telephone Encounter (Signed)
BP is 118/67 HR was 90 and weight is 189.8, has lost 9 lbs will refill phentermine. Follow up in 4 weeks

## 2023-04-17 ENCOUNTER — Encounter: Payer: Self-pay | Admitting: Adult Health

## 2023-04-17 ENCOUNTER — Ambulatory Visit (INDEPENDENT_AMBULATORY_CARE_PROVIDER_SITE_OTHER): Payer: Commercial Managed Care - PPO | Admitting: Adult Health

## 2023-04-17 ENCOUNTER — Other Ambulatory Visit (HOSPITAL_COMMUNITY)
Admission: RE | Admit: 2023-04-17 | Discharge: 2023-04-17 | Disposition: A | Discharge status: Home / Self Care | Payer: Commercial Managed Care - PPO | Source: Ambulatory Visit | Attending: Adult Health | Admitting: Adult Health

## 2023-04-17 VITALS — BP 123/82 | HR 83 | Ht 62.0 in | Wt 190.0 lb

## 2023-04-17 DIAGNOSIS — Z1211 Encounter for screening for malignant neoplasm of colon: Secondary | ICD-10-CM | POA: Diagnosis not present

## 2023-04-17 DIAGNOSIS — Z1329 Encounter for screening for other suspected endocrine disorder: Secondary | ICD-10-CM | POA: Insufficient documentation

## 2023-04-17 DIAGNOSIS — Z1231 Encounter for screening mammogram for malignant neoplasm of breast: Secondary | ICD-10-CM

## 2023-04-17 DIAGNOSIS — Z1331 Encounter for screening for depression: Secondary | ICD-10-CM | POA: Diagnosis not present

## 2023-04-17 DIAGNOSIS — F419 Anxiety disorder, unspecified: Secondary | ICD-10-CM | POA: Diagnosis not present

## 2023-04-17 DIAGNOSIS — Z713 Dietary counseling and surveillance: Secondary | ICD-10-CM | POA: Diagnosis not present

## 2023-04-17 DIAGNOSIS — Z01419 Encounter for gynecological examination (general) (routine) without abnormal findings: Secondary | ICD-10-CM | POA: Insufficient documentation

## 2023-04-17 DIAGNOSIS — Z6834 Body mass index (BMI) 34.0-34.9, adult: Secondary | ICD-10-CM | POA: Diagnosis not present

## 2023-04-17 DIAGNOSIS — Z13220 Encounter for screening for lipoid disorders: Secondary | ICD-10-CM

## 2023-04-17 LAB — HEMOCCULT GUIAC POC 1CARD (OFFICE): Fecal Occult Blood, POC: NEGATIVE

## 2023-04-17 MED ORDER — PHENTERMINE HCL 37.5 MG PO TABS: 37.5000 mg | ORAL_TABLET | Freq: Every day | ORAL | 0 refills | Status: DC

## 2023-04-17 MED ORDER — SERTRALINE HCL 50 MG PO TABS: 50.0000 mg | ORAL_TABLET | Freq: Every day | ORAL | 12 refills | Status: AC

## 2023-04-17 NOTE — Progress Notes (Signed)
 Patient ID: [name], female   DOB: [birth year], 43 y.o.   MRN: [id number] History of Present Illness: [name] is a 43 year old white female, married, [id number] in for a well woman gyn exam and pap. 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  Has been on phentermine  and lost 8 lbs.    Current Medications, Allergies, Past Medical History, Past Surgical History, Family History and Social History were reviewed in [employer] record.     Review of Systems: Patient denies any headaches, hearing loss, fatigue, blurred vision, shortness of breath, chest pain, abdominal pain, problems with bowel movements, urination, or intercourse. No joint pain or mood swings.  Has some hot flashes    Physical Exam:BP 123/82 (BP Location: Left Arm, Patient Position: Sitting, Cuff Size: Normal)   Pulse 83   Ht 5' 2 (1.575 m)   Wt 190 lb (86.2 kg)   BMI 34.75 kg/m   General:  Well developed, well nourished, no acute distress Skin:  Warm and dry Neck:  Midline trachea, normal thyroid, good ROM, no lymphadenopathy Lungs; Clear to auscultation bilaterally Breast:  No dominant palpable mass, retraction, or nipple discharge Cardiovascular: Regular rate and rhythm Abdomen:  Soft, non tender, no hepatosplenomegaly Pelvic:  External genitalia is normal in appearance, no lesions.  The vagina is normal in appearance. Urethra has no lesions or masses. The cervix is smooth, pap with HR HPV genotyping performed, friable with EC brush.  Uterus is felt to be normal size, shape, and contour.  No adnexal masses or tenderness noted.Bladder is non tender, no masses felt. Rectal: Good sphincter tone, no polyps, + hemorrhoids felt.  Hemoccult negative. Extremities/musculoskeletal:  No swelling or varicosities noted, no clubbing or cyanosis Psych:  No mood changes, alert and cooperative,seems happy AA is 0 Fall risk is low    04/17/2023    8:41 AM 08/19/2021   10:50 AM 06/21/2021   10:35  AM  Depression screen PHQ 2/9  Decreased Interest 0 0 1  Down, Depressed, Hopeless 0 0 1  PHQ - 2 Score 0 0 2  Altered sleeping 0  0  Tired, decreased energy 2  3  Change in appetite 2  1  Feeling bad or failure about yourself  0  0  Trouble concentrating 1  0  Moving slowly or fidgety/restless 0  0  Suicidal thoughts 0  0  PHQ-9 Score 5  6       04/17/2023    8:42 AM 06/21/2021   10:35 AM 05/12/2020   11:38 AM  GAD 7 : Generalized Anxiety Score  Nervous, Anxious, on Edge 0 2 0  Control/stop worrying 1 3 1   Worry too much - different things 1 3 0  Trouble relaxing 0 0 0  Restless 0 0 0  Easily annoyed or irritable 1 1 1   Afraid - awful might happen 0 0 0  Total GAD 7 Score 3 9 2       Upstream - 04/17/23 0839       Pregnancy Intention Screening   Does the patient want to become pregnant in the next year? No    Does the patient's partner want to become pregnant in the next year? No    Would the patient like to discuss contraceptive options today? No      Contraception Wrap Up   Current Method Female Sterilization    End Method Female Sterilization    Contraception Counseling Provided No  Examination chaperoned by Clarita Salt LPN   Impression and plan: 1. Encounter for gynecological examination with Papanicolaou smear of cervix (Primary) Pap sent Pap in 3-5 years if normal Physical in 1 year Will check labs, she is fasting  - Cytology - PAP( [place]) - CBC - Comprehensive metabolic panel - TSH - Lipid panel  2. Encounter for screening fecal occult blood testing Hemoccult was negative  - POCT occult blood stool  3. Anxiety Good with Zoloft  50 mg will refill Meds ordered this encounter  Medications   sertraline  (ZOLOFT ) 50 MG tablet    Sig: Take 1 tablet (50 mg total) by mouth daily.    Dispense:  30 tablet    Refill:  12    Supervising Provider:   JAYNE MINDER H [[id number]]   phentermine  (ADIPEX-P ) 37.5 MG tablet    Sig: Take 1 tablet (37.5  mg total) by mouth daily before breakfast. Take 1 daily    Dispense:  30 tablet    Refill:  0    Supervising Provider:   JAYNE, LUTHER H [[id number]]     4. Weight loss counseling, encounter for Continue weight loss efforts Will refill phentermine  Send me weight and BP in 4 weeks   5. Body mass index 34.0-34.9, adult   6. Screening cholesterol level - Lipid panel  7. Screening for thyroid disorder - TSH  8. Screening mammogram for breast cancer Mammogram scheduled for her at [facility] for 04/26/23 at 12:30 pm  - MM 3D SCREENING MAMMOGRAM BILATERAL BREAST; Future

## 2023-04-18 LAB — COMPREHENSIVE METABOLIC PANEL
ALT: 18 [IU]/L (ref 0–32)
AST: 15 [IU]/L (ref 0–40)
Albumin: 4.4 g/dL (ref 3.9–4.9)
Alkaline Phosphatase: 63 [IU]/L (ref 44–121)
BUN/Creatinine Ratio: 21 (ref 9–23)
BUN: 13 mg/dL (ref 6–24)
Bilirubin Total: 0.5 mg/dL (ref 0.0–1.2)
CO2: 23 mmol/L (ref 20–29)
Calcium: 9.5 mg/dL (ref 8.7–10.2)
Chloride: 103 mmol/L (ref 96–106)
Creatinine, Ser: 0.63 mg/dL (ref 0.57–1.00)
Globulin, Total: 2.6 g/dL (ref 1.5–4.5)
Glucose: 97 mg/dL (ref 70–99)
Potassium: 4.2 mmol/L (ref 3.5–5.2)
Sodium: 141 mmol/L (ref 134–144)
Total Protein: 7 g/dL (ref 6.0–8.5)
eGFR: 115 mL/min/{1.73_m2} (ref >59)

## 2023-04-18 LAB — LIPID PANEL
Chol/HDL Ratio: 5 {ratio} — ABNORMAL HIGH (ref 0.0–4.4)
Cholesterol, Total: 236 mg/dL — ABNORMAL HIGH (ref 100–199)
HDL: 47 mg/dL (ref >39)
LDL Chol Calc (NIH): 165 mg/dL — ABNORMAL HIGH (ref 0–99)
Triglycerides: 131 mg/dL (ref 0–149)
VLDL Cholesterol Cal: 24 mg/dL (ref 5–40)

## 2023-04-18 LAB — CBC
Hematocrit: 44.7 % (ref 34.0–46.6)
Hemoglobin: 14.9 g/dL (ref 11.1–15.9)
MCH: 30.8 pg (ref 26.6–33.0)
MCHC: 33.3 g/dL (ref 31.5–35.7)
MCV: 93 fL (ref 79–97)
Platelets: 274 10*3/uL (ref 150–450)
RBC: 4.83 x10E6/uL (ref 3.77–5.28)
RDW: 11.9 % (ref 11.7–15.4)
WBC: 6.9 10*3/uL (ref 3.4–10.8)

## 2023-04-18 LAB — TSH: TSH: 2.12 u[IU]/mL (ref 0.450–4.500)

## 2023-04-20 LAB — CYTOLOGY - PAP
Comment: NEGATIVE
Diagnosis: UNDETERMINED — AB
High risk HPV: NEGATIVE

## 2023-04-23 ENCOUNTER — Encounter: Payer: Self-pay | Admitting: Adult Health

## 2023-04-23 DIAGNOSIS — R87610 Atypical squamous cells of undetermined significance on cytologic smear of cervix (ASC-US): Secondary | ICD-10-CM | POA: Insufficient documentation

## 2023-04-26 ENCOUNTER — Encounter (HOSPITAL_COMMUNITY): Payer: Self-pay

## 2023-04-26 ENCOUNTER — Ambulatory Visit (HOSPITAL_COMMUNITY)
Admission: RE | Admit: 2023-04-26 | Discharge: 2023-04-26 | Disposition: A | Discharge status: Home / Self Care | Payer: Commercial Managed Care - PPO | Source: Ambulatory Visit | Attending: Adult Health | Admitting: Adult Health

## 2023-04-26 DIAGNOSIS — Z1231 Encounter for screening mammogram for malignant neoplasm of breast: Secondary | ICD-10-CM | POA: Insufficient documentation

## 2023-04-30 ENCOUNTER — Other Ambulatory Visit (HOSPITAL_COMMUNITY): Payer: Self-pay | Admitting: Adult Health

## 2023-04-30 DIAGNOSIS — R928 Other abnormal and inconclusive findings on diagnostic imaging of breast: Secondary | ICD-10-CM

## 2023-05-21 ENCOUNTER — Telehealth: Payer: Self-pay | Admitting: Adult Health

## 2023-05-21 MED ORDER — PHENTERMINE HCL 37.5 MG PO TABS: 37.5000 mg | ORAL_TABLET | Freq: Every day | ORAL | 0 refills | Status: AC

## 2023-05-21 NOTE — Telephone Encounter (Signed)
 BP was 123/74. Weight was 187.5, will refill phentermine  lost 2.5 lbs

## 2023-06-12 ENCOUNTER — Ambulatory Visit (HOSPITAL_COMMUNITY)
Admission: RE | Admit: 2023-06-12 | Discharge: 2023-06-12 | Disposition: A | Discharge status: Home / Self Care | Payer: Commercial Managed Care - PPO | Source: Ambulatory Visit | Attending: Adult Health | Admitting: Adult Health

## 2023-06-12 DIAGNOSIS — R928 Other abnormal and inconclusive findings on diagnostic imaging of breast: Secondary | ICD-10-CM

## 2024-06-19 ENCOUNTER — Encounter (HOSPITAL_COMMUNITY)

## 2024-06-19 DIAGNOSIS — Z1231 Encounter for screening mammogram for malignant neoplasm of breast: Secondary | ICD-10-CM

## 2024-07-08 ENCOUNTER — Ambulatory Visit: Admitting: Adult Health
